# Patient Record
Sex: Female | Born: 1950
Health system: Southern US, Community
[De-identification: ages and names within clinical notes are randomized; demographics above are authoritative.]

## PROBLEM LIST (undated history)

## (undated) DIAGNOSIS — E079 Disorder of thyroid, unspecified: Secondary | ICD-10-CM

## (undated) DIAGNOSIS — T7840XA Allergy, unspecified, initial encounter: Secondary | ICD-10-CM

## (undated) HISTORY — DX: Allergy, unspecified, initial encounter: T78.40XA

## (undated) HISTORY — PX: TONSILLECTOMY: SUR1361

## (undated) HISTORY — PX: BREAST EXCISIONAL BIOPSY: SUR124

## (undated) HISTORY — DX: Disorder of thyroid, unspecified: E07.9

---

## 1998-12-15 ENCOUNTER — Encounter: Payer: Self-pay | Admitting: Family Medicine

## 1998-12-15 ENCOUNTER — Ambulatory Visit (HOSPITAL_COMMUNITY): Admission: RE | Admit: 1998-12-15 | Discharge: 1998-12-15 | Payer: Self-pay | Admitting: Obstetrics and Gynecology

## 2000-05-02 ENCOUNTER — Encounter: Payer: Self-pay | Admitting: Otolaryngology

## 2000-05-02 ENCOUNTER — Encounter: Admission: RE | Admit: 2000-05-02 | Discharge: 2000-05-02 | Payer: Self-pay | Admitting: Otolaryngology

## 2000-08-01 ENCOUNTER — Ambulatory Visit (HOSPITAL_COMMUNITY): Admission: RE | Admit: 2000-08-01 | Discharge: 2000-08-01 | Payer: Self-pay | Admitting: Family Medicine

## 2000-08-01 ENCOUNTER — Encounter: Payer: Self-pay | Admitting: Family Medicine

## 2001-08-31 ENCOUNTER — Ambulatory Visit (HOSPITAL_COMMUNITY): Admission: RE | Admit: 2001-08-31 | Discharge: 2001-08-31 | Payer: Self-pay | Admitting: Obstetrics and Gynecology

## 2001-08-31 ENCOUNTER — Encounter: Payer: Self-pay | Admitting: Obstetrics and Gynecology

## 2019-05-28 ENCOUNTER — Ambulatory Visit (INDEPENDENT_AMBULATORY_CARE_PROVIDER_SITE_OTHER): Payer: Medicare Other | Admitting: Allergy and Immunology

## 2019-05-28 ENCOUNTER — Other Ambulatory Visit: Payer: Self-pay

## 2019-05-28 VITALS — BP 130/80 | HR 62 | Resp 16

## 2019-05-28 DIAGNOSIS — J3089 Other allergic rhinitis: Secondary | ICD-10-CM | POA: Diagnosis not present

## 2019-05-28 MED ORDER — EPINEPHRINE 0.3 MG/0.3ML IJ SOAJ: 0.3000 mg | INTRAMUSCULAR | 2 refills | Status: DC | PRN

## 2019-05-28 NOTE — Patient Instructions (Addendum)
  1.  Continue immunotherapy from [place] [facility]  2.  Continue Flonase-1-2 sprays each nostril 1-7 times per week  3.  Continue OTC antihistamine if needed  4.  Continue EpiPen 0.3, Benadryl, MD/ER evaluation for allergic reaction  5.  Obtain a flu vaccine (and COVID vaccine) every year  6.  Return to clinic in 1 year or earlier if problem

## 2019-05-28 NOTE — Progress Notes (Signed)
Immunotherapy   Patient Details  Name: Valerie Crawford MRN: [id number] Date of Birth: 1951-05-06  05/28/2019  Valerie Crawford   MOLD-DM Following schedule: Other, Please see outside records for dosing  Frequency: .80mL every 7-31 days  Epi-Pen: Yes Consent signed and patient instructions given.   Ashleigh Fernandez-Vernon 05/28/2019, 10:06 AM

## 2019-05-28 NOTE — Progress Notes (Signed)
[place] - [place] - [place] - [place] - [place]   NEW PATIENT NOTE  Referring Provider: No ref. provider found Primary Provider: Patient, No Pcp Per Date of office visit: 05/28/2019    Subjective:   Chief Complaint:  Valerie Crawford (DOB: 02/12/1951) is a 68 y.o. female who presents to the clinic on 05/28/2019 with a chief complaint of Transfer allergy vials .  HPI: Valerie AngCathy presents to this clinic in evaluation of allergic disease.  Valerie MessierKathy has a long history of allergic rhinoconjunctivitis.  She started immunotherapy at [facility] in [place] about 2 years ago and has received dramatic improvement regarding both her eye and nose issues while using this form of therapy.  Currently she is using this form of therapy every 4 weeks without any adverse effect.  She continues to use Flonase and rarely an antihistamine.  She has a history of intermittent vertigo treated successfully with positional maneuvering and as needed meclizine.  History reviewed. No pertinent past medical history.  History reviewed. No pertinent surgical history.  Allergies as of 05/28/2019      Reactions   Other    Contrast dye - rash   Statins    Flu like syndrome      Medication List      fluticasone 50 MCG/ACT nasal spray Commonly known as: FLONASE Place into the nose.   levothyroxine 75 MCG tablet Commonly known as: SYNTHROID TK 1 T PO D   meclizine 12.5 MG tablet Commonly known as: ANTIVERT TK 1 T PO QHS PRF DIZZINESS       Review of systems negative except as noted in HPI / PMHx or noted below:  Review of Systems  Constitutional: Negative.   HENT: Negative.   Eyes: Negative.   Respiratory: Negative.   Cardiovascular: Negative.   Gastrointestinal: Negative.   Genitourinary: Negative.   Musculoskeletal: Negative.   Skin: Negative.   Neurological: Negative.   Endo/Heme/Allergies:       Hypothyroidism treated with replacement therapy   Psychiatric/Behavioral: Negative.     History reviewed. No pertinent family history.  Social History   Socioeconomic History  . Marital status: Divorced    Spouse name: Not on file  . Number of children: Not on file  . Years of education: Not on file  . Highest education level: Not on file  Occupational History  . Not on file  Social Needs  . Financial resource strain: Not on file  . Food insecurity    Worry: Not on file    Inability: Not on file  . Transportation needs    Medical: Not on file    Non-medical: Not on file  Tobacco Use  . Smoking status: Not on file  Substance and Sexual Activity  . Alcohol use: Not on file  . Drug use: Not on file  . Sexual activity: Not on file  Lifestyle  . Physical activity    Days per week: Not on file    Minutes per session: Not on file  . Stress: Not on file  Relationships  . Social Musicianconnections    Talks on phone: Not on file    Gets together: Not on file    Attends religious service: Not on file    Active member of club or organization: Not on file    Attends meetings of clubs or organizations: Not on file    Relationship status: Not on file  . Intimate partner violence    Fear of  current or ex partner: Not on file    Emotionally abused: Not on file    Physically abused: Not on file    Forced sexual activity: Not on file  Other Topics Concern  . Not on file  Social History Narrative  . Not on file    Environmental and Social history  Lives in a apartment with a dry environment, no animals located inside the household, carpet in the bedroom, plastic on the bed, plastic on the pillow, no smoking with inside the household.  She just moved from the [place] area this week.  Objective:   Vitals:   05/28/19 0956  BP: 130/80  Pulse: 62  Resp: 16  SpO2: 97%        Physical Exam Constitutional:      Appearance: She is not diaphoretic.  HENT:     Head: Normocephalic. No right periorbital erythema or left  periorbital erythema.     Right Ear: Tympanic membrane, ear canal and external ear normal.     Left Ear: Tympanic membrane, ear canal and external ear normal.     Nose: Nose normal. No mucosal edema or rhinorrhea.     Mouth/Throat:     Pharynx: No oropharyngeal exudate.  Eyes:     General: Lids are normal.     Conjunctiva/sclera: Conjunctivae normal.     Pupils: Pupils are equal, round, and reactive to light.  Neck:     Thyroid: No thyromegaly.     Trachea: Trachea normal. No tracheal deviation.  Cardiovascular:     Rate and Rhythm: Normal rate and regular rhythm.     Heart sounds: Normal heart sounds, S1 normal and S2 normal. No murmur.  Pulmonary:     Effort: Pulmonary effort is normal. No respiratory distress.     Breath sounds: No stridor. No wheezing or rales.  Chest:     Chest wall: No tenderness.  Abdominal:     General: There is no distension.     Palpations: Abdomen is soft. There is no mass.     Tenderness: There is no abdominal tenderness. There is no guarding or rebound.  Musculoskeletal:        General: No tenderness.  Lymphadenopathy:     Head:     Right side of head: No tonsillar adenopathy.     Left side of head: No tonsillar adenopathy.     Cervical: No cervical adenopathy.  Skin:    Coloration: Skin is not pale.     Findings: No erythema or rash.     Nails: There is no clubbing.   Neurological:     Mental Status: She is alert.     Diagnostics: Allergy skin tests were not performed.   Assessment and Plan:    1. Other allergic rhinitis     1.  Continue immunotherapy from [place] [facility]  2.  Continue Flonase-1-2 sprays each nostril 1-7 times per week  3.  Continue OTC antihistamine if needed  4.  Continue EpiPen 0.3, Benadryl, MD/ER evaluation for allergic reaction  5.  Obtain a flu vaccine (and COVID vaccine) every year  6.  Return to clinic in 1 year or earlier if problem  Valerie Crawford will continue to use immunotherapy from  [facility] in [place] and other anti-inflammatory therapy for her airway as noted above.  Assuming she does well I can see her back in this clinic every year while she continues to receive immunotherapy.  Valerie Katz, MD Allergy / Immunology  [facility]

## 2019-05-29 ENCOUNTER — Encounter: Payer: Self-pay | Admitting: Allergy and Immunology

## 2019-06-25 ENCOUNTER — Ambulatory Visit (INDEPENDENT_AMBULATORY_CARE_PROVIDER_SITE_OTHER): Payer: Medicare Other | Admitting: *Deleted

## 2019-06-25 DIAGNOSIS — J3089 Other allergic rhinitis: Secondary | ICD-10-CM

## 2019-07-23 ENCOUNTER — Ambulatory Visit (INDEPENDENT_AMBULATORY_CARE_PROVIDER_SITE_OTHER): Payer: Medicare Other

## 2019-07-23 ENCOUNTER — Telehealth: Payer: Self-pay

## 2019-07-23 DIAGNOSIS — J3089 Other allergic rhinitis: Secondary | ICD-10-CM | POA: Diagnosis not present

## 2019-07-23 NOTE — Telephone Encounter (Signed)
Patient's current vials are getting low. I have faxed the refill request to [place].

## 2019-07-30 NOTE — Telephone Encounter (Signed)
Patient's allergen vial has been mailed to the office and has been filed away accordingly.

## 2019-08-02 ENCOUNTER — Other Ambulatory Visit: Payer: Self-pay | Admitting: Family Medicine

## 2019-08-02 DIAGNOSIS — R928 Other abnormal and inconclusive findings on diagnostic imaging of breast: Secondary | ICD-10-CM

## 2019-08-13 ENCOUNTER — Ambulatory Visit
Admission: RE | Admit: 2019-08-13 | Discharge: 2019-08-13 | Disposition: A | Discharge status: Home / Self Care | Payer: Medicare Other | Source: Ambulatory Visit | Attending: Family Medicine | Admitting: Family Medicine

## 2019-08-13 ENCOUNTER — Other Ambulatory Visit: Payer: Self-pay

## 2019-08-13 DIAGNOSIS — R928 Other abnormal and inconclusive findings on diagnostic imaging of breast: Secondary | ICD-10-CM

## 2019-08-20 ENCOUNTER — Ambulatory Visit (INDEPENDENT_AMBULATORY_CARE_PROVIDER_SITE_OTHER): Payer: Medicare Other | Admitting: *Deleted

## 2019-08-20 DIAGNOSIS — J309 Allergic rhinitis, unspecified: Secondary | ICD-10-CM

## 2019-09-17 ENCOUNTER — Ambulatory Visit (INDEPENDENT_AMBULATORY_CARE_PROVIDER_SITE_OTHER): Payer: Medicare Other

## 2019-09-17 DIAGNOSIS — J309 Allergic rhinitis, unspecified: Secondary | ICD-10-CM | POA: Diagnosis not present

## 2019-09-27 ENCOUNTER — Ambulatory Visit: Payer: Self-pay | Admitting: *Deleted

## 2019-10-28 ENCOUNTER — Ambulatory Visit (INDEPENDENT_AMBULATORY_CARE_PROVIDER_SITE_OTHER): Payer: Medicare Other

## 2019-10-28 DIAGNOSIS — J309 Allergic rhinitis, unspecified: Secondary | ICD-10-CM

## 2019-11-04 ENCOUNTER — Ambulatory Visit (INDEPENDENT_AMBULATORY_CARE_PROVIDER_SITE_OTHER): Payer: Medicare Other

## 2019-11-04 DIAGNOSIS — J309 Allergic rhinitis, unspecified: Secondary | ICD-10-CM | POA: Diagnosis not present

## 2019-11-11 ENCOUNTER — Ambulatory Visit (INDEPENDENT_AMBULATORY_CARE_PROVIDER_SITE_OTHER): Payer: Medicare Other

## 2019-11-11 DIAGNOSIS — J309 Allergic rhinitis, unspecified: Secondary | ICD-10-CM | POA: Diagnosis not present

## 2019-12-10 ENCOUNTER — Ambulatory Visit (INDEPENDENT_AMBULATORY_CARE_PROVIDER_SITE_OTHER): Payer: Medicare Other

## 2019-12-10 DIAGNOSIS — J309 Allergic rhinitis, unspecified: Secondary | ICD-10-CM

## 2019-12-17 ENCOUNTER — Ambulatory Visit (INDEPENDENT_AMBULATORY_CARE_PROVIDER_SITE_OTHER): Payer: Medicare Other | Admitting: Allergy and Immunology

## 2019-12-17 ENCOUNTER — Encounter: Payer: Self-pay | Admitting: Allergy and Immunology

## 2019-12-17 ENCOUNTER — Other Ambulatory Visit: Payer: Self-pay

## 2019-12-17 VITALS — BP 126/70 | HR 67 | Temp 97.6°F | Resp 18 | Ht 64.0 in | Wt 163.2 lb

## 2019-12-17 DIAGNOSIS — J3089 Other allergic rhinitis: Secondary | ICD-10-CM

## 2019-12-17 DIAGNOSIS — Z20822 Contact with and (suspected) exposure to covid-19: Secondary | ICD-10-CM | POA: Diagnosis not present

## 2019-12-17 NOTE — Patient Instructions (Addendum)
  1.  Continue immunotherapy from [facility]  2.  Continue Flonase-1-2 sprays each nostril 1-7 times per week  3.  Continue OTC antihistamine if needed  4.  Continue EpiPen 0.3, Benadryl, MD/ER evaluation for allergic reaction  5.  Blood - Covid IgG... Covid vaccine???  6.  Return to clinic in 1 year or earlier if problem

## 2019-12-17 NOTE — Progress Notes (Signed)
[place] - [place] - [place] - [place] - [place]   Follow-up Note  Referring Provider: No ref. provider found Primary Provider: Patient, No Pcp Per Date of Office Visit: 12/17/2019  Subjective:   Valerie Crawford (DOB: February 07, 1951) is a 69 y.o. female who [facility] on 12[facility]/2019 in re-evaluation of the following:  HPI: Valerie Crawford returns to this clinic in evaluation of allergic rhinoconjunctivitis. I last saw her in this clinic on 28 May 2019.  She has been using a course of immunotherapy from [facility]. Her current administration is every month directed against dust mite and mold.  She has really done very well over the course of the past 6 months while continuing on immunotherapy and a nasal steroid.  She has not required a systemic steroid or an antibiotic for any type of airway issue.  She has not had any adverse effect from her immunotherapy.  She relates a history of having exposure to Covid on 2 different occasions.  She never developed any type of symptomatic infection.  She is very hesitant about receiving a Covid vaccine as she had a significant problem with flu vaccines in the past.  Basically her problem with flu vaccines in the is that she became "sick" with what sounds like constitutional symptoms and maybe low-grade fever.  Allergies as of 12/17/2019      Reactions   Other    Contrast dye - rash   Statins    Flu like syndrome      Medication List    EPINEPHrine 0.3 mg/0.3 mL Soaj injection Commonly known as: EPI-PEN Inject 0.3 mLs (0.3 mg total) into the muscle as needed for anaphylaxis.   fluticasone 50 MCG/ACT nasal spray Commonly known as: FLONASE Place into the nose.   levothyroxine 75 MCG tablet Commonly known as: SYNTHROID TK 1 T PO D       History reviewed. No pertinent past medical history.  Past Surgical History:  Procedure Laterality Date  . TONSILLECTOMY      Review of systems negative  except as noted in HPI / PMHx or noted below:  Review of Systems  Constitutional: Negative.   HENT: Negative.   Eyes: Negative.   Respiratory: Negative.   Cardiovascular: Negative.   Gastrointestinal: Negative.   Genitourinary: Negative.   Musculoskeletal: Negative.   Skin: Negative.   Neurological: Negative.   Endo/Heme/Allergies: Negative.   Psychiatric/Behavioral: Negative.      Objective:   Vitals:   12/17/19 1408  BP: 126/70  Crawford: 67  Resp: 18  Temp: 97.6 F (36.4 C)  SpO2: 96%   Height: 5\' 4"  (162.6 cm)  Weight: 163 lb 3.2 oz (74 kg)   Physical Exam Constitutional:      Appearance: She is not diaphoretic.  HENT:     Head: Normocephalic.     Right Ear: Tympanic membrane, ear canal and external ear normal.     Left Ear: Tympanic membrane, ear canal and external ear normal.     Nose: Nose normal. No mucosal edema or rhinorrhea.     Mouth/Throat:     Pharynx: Uvula midline. No oropharyngeal exudate.  Eyes:     Conjunctiva/sclera: Conjunctivae normal.  Neck:     Thyroid: No thyromegaly.     Trachea: Trachea normal. No tracheal tenderness or tracheal deviation.  Cardiovascular:     Rate and Rhythm: Normal rate and regular rhythm.     Heart sounds: Normal heart sounds, S1 normal and  S2 normal. No murmur.  Pulmonary:     Effort: No respiratory distress.     Breath sounds: Normal breath sounds. No stridor. No wheezing or rales.  Lymphadenopathy:     Head:     Right side of head: No tonsillar adenopathy.     Left side of head: No tonsillar adenopathy.     Cervical: No cervical adenopathy.  Skin:    Findings: No erythema or rash.     Nails: There is no clubbing.  Neurological:     Mental Status: She is alert.     Diagnostics: none  Assessment and Plan:   1. Perennial allergic rhinitis   2. Close exposure to COVID-19 virus     1.  Continue immunotherapy from [place] [facility]  2.  Continue Flonase-1-2 sprays each nostril 1-7 times per  week  3.  Continue OTC antihistamine if needed  4.  Continue EpiPen 0.3, Benadryl, MD/ER evaluation for allergic reaction  5.  Blood - Covid IgG... Covid vaccine???  6.  Return to clinic in 1 year or earlier if problem  Valerie Crawford will continue to use immunotherapy as this has resulted in very good improvement regarding her respiratory tract and conjunctival problems.  She will continue on the formulation from [facility] and also continue to use anti-inflammatory agents for her airway as noted above.  I will collect a Covid IgG titer and if it is positive then one could make the argument that the way she expresses a Covid infection is asymptomatically and there is no need for vaccine.  If it is negative then I would recommend that she undergo a Covid vaccination.  However, she is very hesitant about receiving a vaccine.  I also informed her that we have a protocol for testing for vaccine reactions and we could certainly have her undergo that evaluation which may give her some peace of mind to obtain the Covid vaccine.  [name], MD Allergy / Immunology [place]

## 2019-12-18 ENCOUNTER — Ambulatory Visit (INDEPENDENT_AMBULATORY_CARE_PROVIDER_SITE_OTHER): Payer: Medicare Other | Admitting: Internal Medicine

## 2019-12-18 ENCOUNTER — Encounter: Payer: Self-pay | Admitting: Internal Medicine

## 2019-12-18 ENCOUNTER — Encounter: Payer: Self-pay | Admitting: Allergy and Immunology

## 2019-12-18 VITALS — BP 119/70 | HR 76 | Temp 97.3°F | Ht 64.0 in | Wt 164.0 lb

## 2019-12-18 DIAGNOSIS — E7849 Other hyperlipidemia: Secondary | ICD-10-CM

## 2019-12-18 NOTE — Patient Instructions (Addendum)
Medication Instructions:  Your physician recommends that you continue on your current medications as directed. Please refer to the Current Medication list given to you today.  *If you need a refill on your cardiac medications before your next appointment, please call your pharmacy*  Lab Work: NONE If you have labs (blood work) drawn today and your tests are completely normal, you will receive your results only by: Marland Kitchen MyChart Message (if you have MyChart) OR . A paper copy in the mail If you have any lab test that is abnormal or we need to change your treatment, we will call you to review the results.  Testing/Procedures: Dr. Debara Pickett has ordered a CT coronary calcium score. This is $150 out of pocket  Coronary CalciumScan A coronary calcium scan is an imaging test used to look for deposits of calcium and other fatty materials (plaques) in the inner lining of the blood vessels of the heart (coronary arteries). These deposits of calcium and plaques can partly clog and narrow the coronary arteries without producing any symptoms or warning signs. This puts a person at risk for a heart attack. This test can detect these deposits before symptoms develop. Tell a health care provider about:  Any allergies you have.  All medicines you are taking, including vitamins, herbs, eye drops, creams, and over-the-counter medicines.  Any problems you or family members have had with anesthetic medicines.  Any blood disorders you have.  Any surgeries you have had.  Any medical conditions you have.  Whether you are pregnant or may be pregnant. What are the risks? Generally, this is a safe procedure. However, problems may occur, including:  Harm to a pregnant woman and her unborn baby. This test involves the use of radiation. Radiation exposure can be dangerous to a pregnant woman and her unborn baby. If you are pregnant, you generally should not have this procedure done.  Slight increase in the risk of  cancer. This is because of the radiation involved in the test. What happens before the procedure? No preparation is needed for this procedure. What happens during the procedure?  You will undress and remove any jewelry around your neck or chest.  You will put on a hospital gown.  Sticky electrodes will be placed on your chest. The electrodes will be connected to an electrocardiogram (ECG) machine to record a tracing of the electrical activity of your heart.  A CT scanner will take pictures of your heart. During this time, you will be asked to lie still and hold your breath for 2-3 seconds while a picture of your heart is being taken. The procedure may vary among health care providers and hospitals. What happens after the procedure?  You can get dressed.  You can return to your normal activities.  It is up to you to get the results of your test. Ask your health care provider, or the department that is doing the test, when your results will be ready. Summary  A coronary calcium scan is an imaging test used to look for deposits of calcium and other fatty materials (plaques) in the inner lining of the blood vessels of the heart (coronary arteries).  Generally, this is a safe procedure. Tell your health care provider if you are pregnant or may be pregnant.  No preparation is needed for this procedure.  A CT scanner will take pictures of your heart.  You can return to your normal activities after the scan is done. This information is not intended to replace advice  given to you by your health care provider. Make sure you discuss any questions you have with your health care provider. Document Released: 04/28/2008 Document Revised: 09/19/2016 Document Reviewed: 09/19/2016 Elsevier Interactive Patient Education  2017 [employer].    Follow-Up: At [facility], you and your health needs are our priority.  As part of our continuing mission to provide you with exceptional heart care, we  have created designated Provider Care Teams.  These Care Teams include your primary Cardiologist (physician) and Advanced Practice Providers (APPs -  Physician Assistants and Nurse Practitioners) who all work together to provide you with the care you need, when you need it.  Your next appointment:   After coronary calcium test is completed   The format for your next appointment:   Virtual Visit - lipid clinic  Provider:   K. Italy Hilty, MD  Other Instructions

## 2019-12-18 NOTE — Progress Notes (Signed)
LIPID CLINIC CONSULT NOTE  Chief Complaint:  Manage dyslipidemia  Primary Care Physician: Clayborn Heron, MD  Primary Cardiologist:  No primary care provider on file.  HPI:  Valerie Crawford is a 69 y.o. female who is being seen today for the evaluation of dyslipidemia at the request of Rankins, Fanny Dance, MD.  This is a pleasant 69 year old female kindly referred for evaluation and management of dyslipidemia.  She reports this is been a longstanding problem and she is seen multiple doctors, prior cardiologist and other providers and management of this both out in [place] and more recently when she was living in [place].  She is recently retired and was referred for further evaluation and management.  She brought records with her from the past 5 or 6 years which showed significant dyslipidemia.  Only at 1 point where she able to reduce her LDL cholesterol well below 200 with significant dietary changes.  She was on the "O blood type" diet.  Unfortunately she has not been able to tolerate statins, all caused flulike symptoms, at least 3 statins and to high potency statins in the past.  She also recently tried ezetimibe and said overall she had flulike symptoms, myalgias and just felt "bad".  Her most recent lipid profile showed total cholesterol 329, triglycerides 342, HDL 46 and LDL of 212.  She reports her mother had significant dyslipidemia however had no early onset heart disease. PMHx:  Past Medical History:  Diagnosis Date  . Allergies   . Thyroid disease     Past Surgical History:  Procedure Laterality Date  . TONSILLECTOMY      FAMHx:  Family History  Problem Relation Age of Onset  . Hyperlipidemia Mother   . Arthritis Father     SOCHx:   reports that she quit smoking about 51 years ago. She has quit using smokeless tobacco. She reports previous alcohol use. She reports that she does not use drugs.  ALLERGIES:  Allergies  Allergen Reactions  . Other    Contrast dye - rash  . Statins     Flu like syndrome    ROS: Pertinent items noted in HPI and remainder of comprehensive ROS otherwise negative.  HOME MEDS: Current Outpatient Medications on File Prior to Visit  Medication Sig Dispense Refill  . EPINEPHrine 0.3 mg/0.3 mL IJ SOAJ injection Inject 0.3 mLs (0.3 mg total) into the muscle as needed for anaphylaxis. 2 each 2  . fluticasone (FLONASE) 50 MCG/ACT nasal spray Place into the nose.    . levothyroxine (SYNTHROID) 75 MCG tablet TK 1 T PO D     No current facility-administered medications on file prior to visit.    LABS/IMAGING: No results found for this or any previous visit (from the past 48 hour(s)). No results found.  LIPID PANEL: No results found for: CHOL, TRIG, HDL, CHOLHDL, VLDL, LDLCALC, LDLDIRECT  WEIGHTS: Wt Readings from Last 3 Encounters:  12/18/19 164 lb (74.4 kg)  12/17/19 163 lb 3.2 oz (74 kg)    VITALS: BP 119/70   Pulse 76   Temp (!) 97.3 F (36.3 C)   Ht 5\' 4"  (1.626 m)   Wt 164 lb (74.4 kg)   SpO2 98%   BMI 28.15 kg/m   EXAM: General appearance: alert and no distress Lungs: clear to auscultation bilaterally Heart: regular rate and rhythm, S1, S2 normal, no murmur, click, rub or gallop Extremities: extremities normal, atraumatic, no cyanosis or edema Neurologic: Grossly normal  EKG: Deferred  ASSESSMENT: 1. Possible familial combined hyperlipidemia 2. No significant early onset heart disease in the family  PLAN: 1.   Valerie Crawford has a significant elevation in triglycerides, LDL and low HDL suggestive of familial combined hyperlipidemia.  Interestingly though she has no evidence for early onset coronary disease.  This was not also an issue in her family.  I am interested as to whether or not there is any evidence for any atherosclerosis and will order coronary calcium score.  If this is significantly elevated then I would likely recommend aggressive treatment to lower LDL greater than 50%,  likely with a PCSK9 inhibitor due to her intolerance to statins.  Alternatively, if there is no or little coronary calcification, would argue that phenotypically her risk is not as high and therefore we may choose an alternative such as Nexletol.  Thanks again for this interesting referral.  Pixie Casino, MD, FACC, [place] Director of the Advanced Lipid Disorders &  Cardiovascular Risk Reduction Clinic Diplomate of the American Board of Clinical Lipidology Attending Cardiologist  Direct Dial: [phone]  Fax: [phone]  Website:  [email or web address]  Valerie Crawford Valerie Crawford 12/18/2019, 3:12 PM

## 2019-12-19 LAB — SAR COV2 SEROLOGY (COVID19)AB(IGG),IA: DiaSorin SARS-CoV-2 Ab, IgG: NEGATIVE

## 2020-01-07 ENCOUNTER — Other Ambulatory Visit: Payer: Self-pay

## 2020-01-07 ENCOUNTER — Ambulatory Visit (INDEPENDENT_AMBULATORY_CARE_PROVIDER_SITE_OTHER)
Admission: RE | Admit: 2020-01-07 | Discharge: 2020-01-07 | Disposition: A | Discharge status: Home / Self Care | Payer: Medicare Other | Source: Ambulatory Visit | Attending: Internal Medicine | Admitting: Internal Medicine

## 2020-01-07 ENCOUNTER — Ambulatory Visit (INDEPENDENT_AMBULATORY_CARE_PROVIDER_SITE_OTHER): Payer: Medicare Other

## 2020-01-07 DIAGNOSIS — E7849 Other hyperlipidemia: Secondary | ICD-10-CM

## 2020-01-07 DIAGNOSIS — J3089 Other allergic rhinitis: Secondary | ICD-10-CM | POA: Diagnosis not present

## 2020-01-09 ENCOUNTER — Other Ambulatory Visit: Payer: Self-pay | Admitting: *Deleted

## 2020-01-09 DIAGNOSIS — E7849 Other hyperlipidemia: Secondary | ICD-10-CM

## 2020-01-09 MED ORDER — NEXLETOL 180 MG PO TABS: 1.0000 | ORAL_TABLET | Freq: Every day | ORAL | 11 refills | Status: DC

## 2020-01-15 ENCOUNTER — Telehealth: Payer: Self-pay | Admitting: Internal Medicine

## 2020-01-15 NOTE — Telephone Encounter (Signed)
PA for Nexletol 180mg  daily submitted via [facility] Key: [facility] - PA Case ID: [facility]

## 2020-01-15 NOTE — Telephone Encounter (Signed)
-----   Message from Lindell Spar, RN sent at 01/15/2020 12:56 PM EST ----- Regarding: nexletol PA ID: [id number] BIN: [id number] PCN: [id number] RxGrp: [id number]

## 2020-01-17 NOTE — Telephone Encounter (Signed)
MyChart message sent to patient about med approval.

## 2020-01-17 NOTE — Telephone Encounter (Signed)
Approved. This drug has been approved under the Member's Medicare Part D benefit. Approved quantity: 30 tablets per 30 day(s). You may fill up to a 90 day supply except for those on Specialty Tier 5, which can be filled up to a 30 day supply.

## 2020-01-21 ENCOUNTER — Ambulatory Visit: Payer: Medicare Other | Admitting: Internal Medicine

## 2020-02-04 ENCOUNTER — Ambulatory Visit (INDEPENDENT_AMBULATORY_CARE_PROVIDER_SITE_OTHER): Payer: Medicare Other

## 2020-02-04 DIAGNOSIS — J3089 Other allergic rhinitis: Secondary | ICD-10-CM

## 2020-02-12 ENCOUNTER — Ambulatory Visit: Payer: Medicare Other | Admitting: Internal Medicine

## 2020-03-02 ENCOUNTER — Encounter: Payer: Self-pay | Admitting: Internal Medicine

## 2020-03-02 ENCOUNTER — Ambulatory Visit (INDEPENDENT_AMBULATORY_CARE_PROVIDER_SITE_OTHER): Payer: Medicare Other | Admitting: Internal Medicine

## 2020-03-02 ENCOUNTER — Other Ambulatory Visit: Payer: Self-pay

## 2020-03-02 VITALS — BP 128/76 | HR 75 | Temp 96.6°F | Ht 64.0 in | Wt 166.6 lb

## 2020-03-02 DIAGNOSIS — E7849 Other hyperlipidemia: Secondary | ICD-10-CM

## 2020-03-02 NOTE — Patient Instructions (Signed)
Medication Instructions:  Your physician recommends that you continue on your current medications as directed. Please refer to the Current Medication list given to you today.  Please contact our office if you decide to start Nexletol  *If you need a refill on your cardiac medications before your next appointment, please call your pharmacy*  Follow-Up: At [facility], you and your health needs are our priority.  As part of our continuing mission to provide you with exceptional heart care, we have created designated Provider Care Teams.  These Care Teams include your primary Cardiologist (physician) and Advanced Practice Providers (APPs -  Physician Assistants and Nurse Practitioners) who all work together to provide you with the care you need, when you need it.  We recommend signing up for the patient portal called "MyChart".  Sign up information is provided on this After Visit Summary.  MyChart is used to connect with patients for Virtual Visits (Telemedicine).  Patients are able to view lab/test results, encounter notes, upcoming appointments, etc.  Non-urgent messages can be sent to your provider as well.   To learn more about what you can do with MyChart, go to [email or web address].    Your next appointment:  As needed with Dr. Rennis Golden

## 2020-03-03 ENCOUNTER — Ambulatory Visit (INDEPENDENT_AMBULATORY_CARE_PROVIDER_SITE_OTHER): Payer: Medicare Other

## 2020-03-03 ENCOUNTER — Encounter: Payer: Self-pay | Admitting: Internal Medicine

## 2020-03-03 DIAGNOSIS — J309 Allergic rhinitis, unspecified: Secondary | ICD-10-CM

## 2020-03-03 NOTE — Progress Notes (Signed)
LIPID CLINIC CONSULT NOTE  Chief Complaint:  Follow-up dyslipidemia  Primary Care Physician: Aretta Nip, MD  Primary Cardiologist:  No primary care provider on file.  HPI:  Valerie Crawford is a 69 y.o. female who is being seen today for the evaluation of dyslipidemia at the request of Rankins, Bill Salinas, MD.  This is a pleasant 69 year old female kindly referred for evaluation and management of dyslipidemia.  She reports this is been a longstanding problem and she is seen multiple doctors, prior cardiologist and other providers and management of this both out in [place] and more recently when she was living in [place].  She is recently retired and was referred for further evaluation and management.  She brought records with her from the past 5 or 6 years which showed significant dyslipidemia.  Only at 1 point where she able to reduce her LDL cholesterol well below 200 with significant dietary changes.  She was on the "O blood type" diet.  Unfortunately she has not been able to tolerate statins, all caused flulike symptoms, at least 3 statins and to high potency statins in the past.  She also recently tried ezetimibe and said overall she had flulike symptoms, myalgias and just felt "bad".  Her most recent lipid profile showed total cholesterol 329, triglycerides 342, HDL 46 and LDL of 212.  She reports her mother had significant dyslipidemia however had no early onset heart disease.  02/22/2020  Valerie Crawford returns today for follow-up.  Only approved for Nexletol as an alternative for management of dyslipidemia.  After much discussion with her, she has not started the medication.  She says she has a lot of hesitation about the possible side effects of medication including risk for upper respiratory infection.  I told her that in my experience so far over the past year as well as preclinical trial experience, this was extremely unlikely and perhaps only happened to 1 patient.   Nonetheless, she is still very hesitant to use the medication.  PMHx:  Past Medical History:  Diagnosis Date  . Allergies   . Thyroid disease     Past Surgical History:  Procedure Laterality Date  . TONSILLECTOMY      FAMHx:  Family History  Problem Relation Age of Onset  . Hyperlipidemia Mother   . Arthritis Father     SOCHx:   reports that she quit smoking about 51 years ago. She has quit using smokeless tobacco. She reports previous alcohol use. She reports that she does not use drugs.  ALLERGIES:  Allergies  Allergen Reactions  . Other     Contrast dye - rash  . Statins     Flu like syndrome    ROS: Pertinent items noted in HPI and remainder of comprehensive ROS otherwise negative.  HOME MEDS: Current Outpatient Medications on File Prior to Visit  Medication Sig Dispense Refill  . Bempedoic Acid (NEXLETOL) 180 MG TABS Take 1 tablet by mouth daily. 30 tablet 11  . EPINEPHrine 0.3 mg/0.3 mL IJ SOAJ injection Inject 0.3 mLs (0.3 mg total) into the muscle as needed for anaphylaxis. 2 each 2  . fluticasone (FLONASE) 50 MCG/ACT nasal spray Place into the nose.    . levothyroxine (SYNTHROID) 75 MCG tablet TK 1 T PO D     No current facility-administered medications on file prior to visit.    LABS/IMAGING: No results found for this or any previous visit (from the past 48 hour(s)). No results found.  LIPID PANEL:  No results found for: CHOL, TRIG, HDL, CHOLHDL, VLDL, LDLCALC, LDLDIRECT  WEIGHTS: Wt Readings from Last 3 Encounters:  03/02/20 166 lb 9.6 oz (75.6 kg)  12/18/19 164 lb (74.4 kg)  12/17/19 163 lb 3.2 oz (74 kg)    VITALS: BP 128/76   Pulse 75   Temp (!) 96.6 F (35.9 C)   Ht 5\' 4"  (1.626 m)   Wt 166 lb 9.6 oz (75.6 kg)   SpO2 96%   BMI 28.60 kg/m   EXAM: General appearance: alert and no distress Lungs: clear to auscultation bilaterally Heart: regular rate and rhythm, S1, S2 normal, no murmur, click, rub or gallop Extremities:  extremities normal, atraumatic, no cyanosis or edema Neurologic: Grossly normal  EKG: Deferred  ASSESSMENT: 1. Possible familial combined hyperlipidemia 2. No significant early onset heart disease in the family  PLAN: 1.   Valerie Crawford as previously mentioned had numerous sensitivities to medications and has reluctance to use Nexletol.  There are few other options for her.  We discussed PCSK9 inhibitors however I do not think she will qualify for coronary artery calcium.  In addition these can be associated with flulike symptoms which she had previously had with statins and other medications.  She will reconsider whether she wants to try Nexletol, otherwise can follow-up with me as needed.  Mel Almond, MD, [facility], FACP  [place]  [facility]  Medical Director of the [facility]id [facility] &  Cardiovascular Risk Reduction Clinic Diplomate of the American Board of Clinical Lipidology Attending Cardiologist  Direct Dial: [phone]  Fax: [phone]  Website:  [phone] 03/03/2020, 9:50 PM

## 2020-03-24 ENCOUNTER — Telehealth: Payer: Self-pay | Admitting: *Deleted

## 2020-03-24 NOTE — Telephone Encounter (Signed)
Called and advised to patient that her allergy vial from [facility] was getting low and that when she comes in for next injection she will need to sign her shot record so that we can fax a request to re-order her next vial. Patient verbalized understanding and will come in next week to get her injection and sign the sheet.

## 2020-03-25 NOTE — Telephone Encounter (Signed)
I have placed a note in patient's flowsheet to be sure she signs reorder forms at her next injection.

## 2020-03-31 ENCOUNTER — Ambulatory Visit (INDEPENDENT_AMBULATORY_CARE_PROVIDER_SITE_OTHER): Payer: Medicare Other

## 2020-03-31 DIAGNOSIS — J309 Allergic rhinitis, unspecified: Secondary | ICD-10-CM

## 2020-04-28 ENCOUNTER — Ambulatory Visit (INDEPENDENT_AMBULATORY_CARE_PROVIDER_SITE_OTHER): Payer: Medicare Other

## 2020-04-28 DIAGNOSIS — J309 Allergic rhinitis, unspecified: Secondary | ICD-10-CM | POA: Diagnosis not present

## 2020-05-26 ENCOUNTER — Ambulatory Visit (INDEPENDENT_AMBULATORY_CARE_PROVIDER_SITE_OTHER): Payer: Medicare Other

## 2020-05-26 DIAGNOSIS — J309 Allergic rhinitis, unspecified: Secondary | ICD-10-CM | POA: Diagnosis not present

## 2020-06-02 ENCOUNTER — Ambulatory Visit (INDEPENDENT_AMBULATORY_CARE_PROVIDER_SITE_OTHER): Payer: Medicare Other

## 2020-06-02 DIAGNOSIS — J309 Allergic rhinitis, unspecified: Secondary | ICD-10-CM

## 2020-06-09 ENCOUNTER — Ambulatory Visit (INDEPENDENT_AMBULATORY_CARE_PROVIDER_SITE_OTHER): Payer: Medicare Other

## 2020-06-09 DIAGNOSIS — J309 Allergic rhinitis, unspecified: Secondary | ICD-10-CM

## 2020-07-07 ENCOUNTER — Ambulatory Visit (INDEPENDENT_AMBULATORY_CARE_PROVIDER_SITE_OTHER): Payer: Medicare Other

## 2020-07-07 DIAGNOSIS — J309 Allergic rhinitis, unspecified: Secondary | ICD-10-CM | POA: Diagnosis not present

## 2020-08-04 ENCOUNTER — Ambulatory Visit (INDEPENDENT_AMBULATORY_CARE_PROVIDER_SITE_OTHER): Payer: Medicare Other | Admitting: *Deleted

## 2020-08-04 DIAGNOSIS — J309 Allergic rhinitis, unspecified: Secondary | ICD-10-CM | POA: Diagnosis not present

## 2020-09-01 ENCOUNTER — Ambulatory Visit (INDEPENDENT_AMBULATORY_CARE_PROVIDER_SITE_OTHER): Payer: Medicare Other

## 2020-09-01 DIAGNOSIS — J309 Allergic rhinitis, unspecified: Secondary | ICD-10-CM | POA: Diagnosis not present

## 2020-09-29 ENCOUNTER — Ambulatory Visit (INDEPENDENT_AMBULATORY_CARE_PROVIDER_SITE_OTHER): Payer: Medicare Other

## 2020-09-29 DIAGNOSIS — J309 Allergic rhinitis, unspecified: Secondary | ICD-10-CM | POA: Diagnosis not present

## 2020-10-19 IMAGING — CT CT CARDIAC CORONARY ARTERY CALCIUM SCORE
3 series · 14 of 20 positions shown, 15 images · non-contrast
Comparison: None.
COMPARISON: None.

Addendum:
EXAM:
OVER-READ INTERPRETATION  CT CHEST

The following report is an over-read performed by radiologist Dr.
Renato David Virgil [REDACTED] on 01/07/2020. This over-read
does not include interpretation of cardiac or coronary anatomy or
pathology. The calcium score interpretation by the cardiologist is
attached.
CLINICAL DATA: Risk stratification
Coronary Calcium Score
TECHNIQUE: The patient was scanned on a Siemens Force scanner. Axial
non-contrast 3 mm slices were carried out through the heart. The
data set was analyzed on a dedicated work station and scored using
the Agatson method.

[Series 2: casc 3.0 bv41 2 bestdiast 68 % · axial · 0.29mm/px · z∈[-247,-169]mm · 4 of 44 slices shown, 5 images]
[im 9/44  vessel]
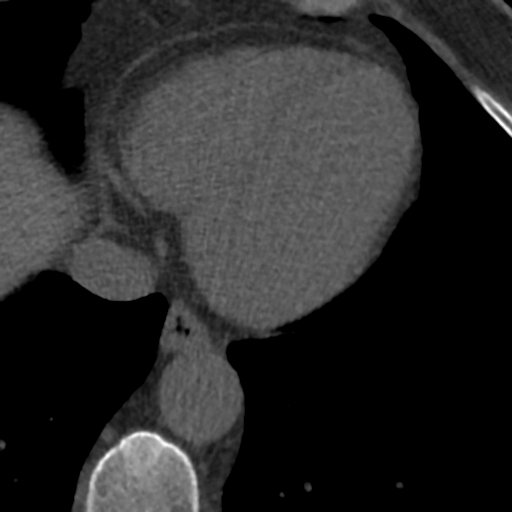
[im 9/44  lung]
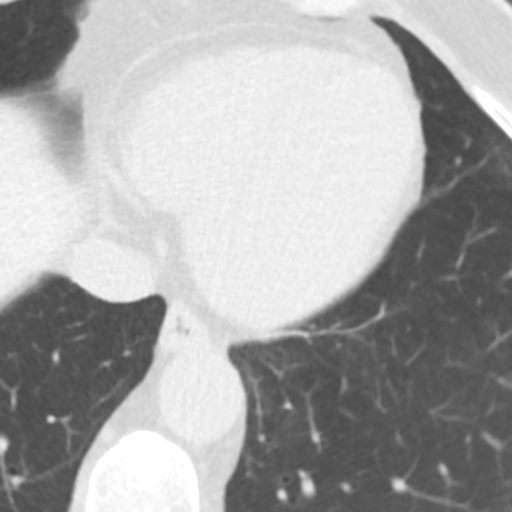
[im 18/44  vessel]
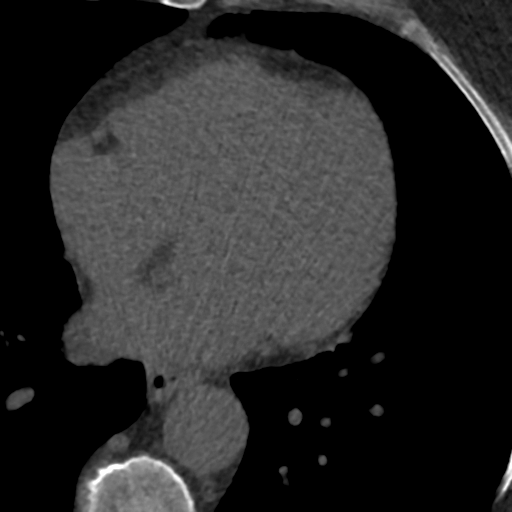
[im 26/44  vessel]
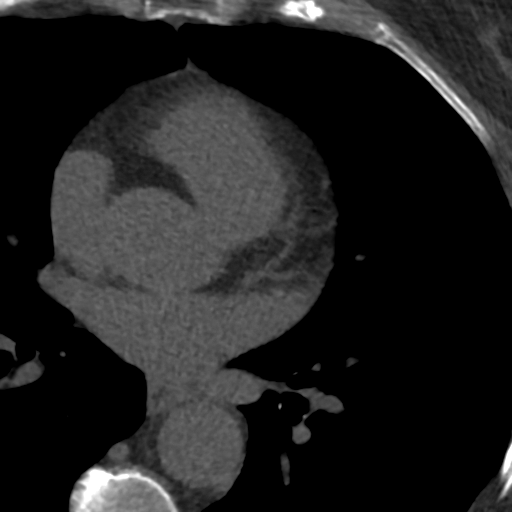
[im 35/44  vessel]
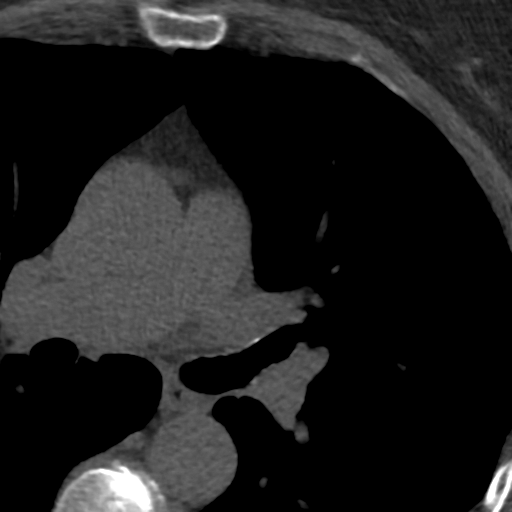

[Series 3: lung 69 % · axial · 0.62mm/px · z∈[-250,-166]mm · 5 of 44 slices shown]
[im 8/44  lung]
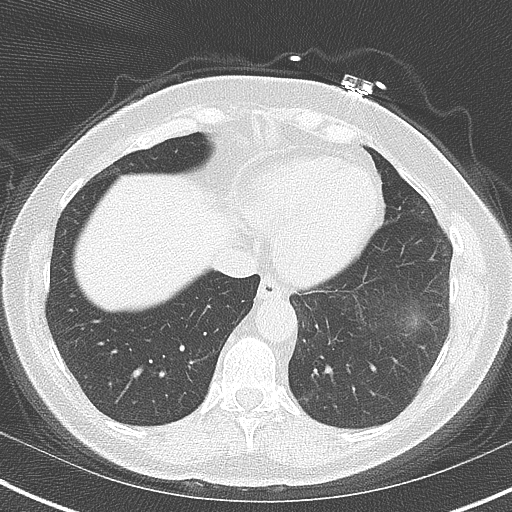
[im 15/44  lung]
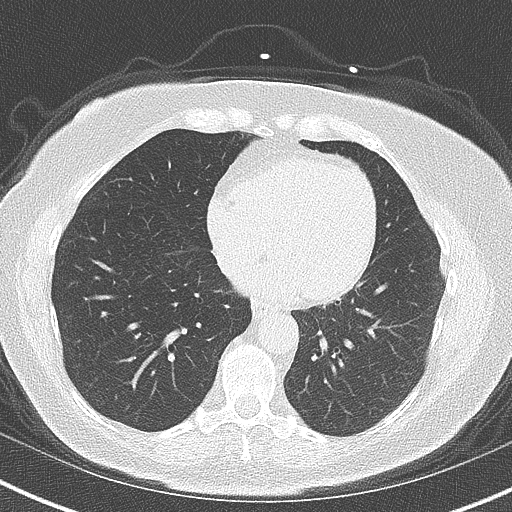
[im 22/44  lung]
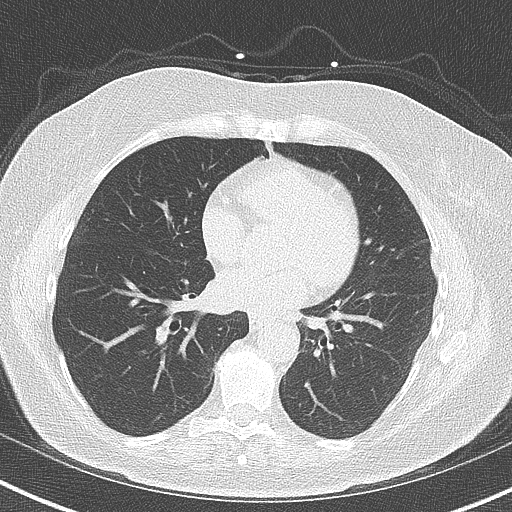
[im 29/44  lung]
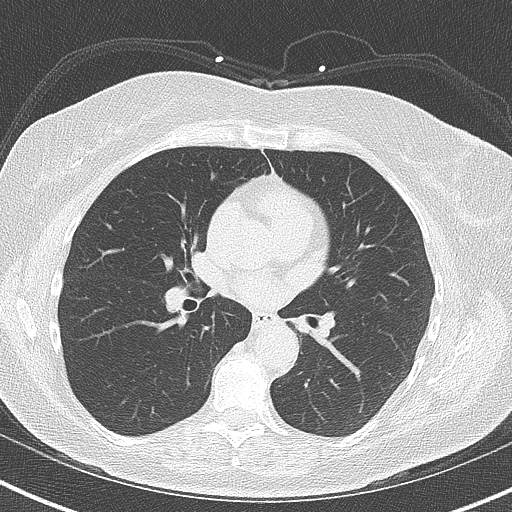
[im 36/44  lung]
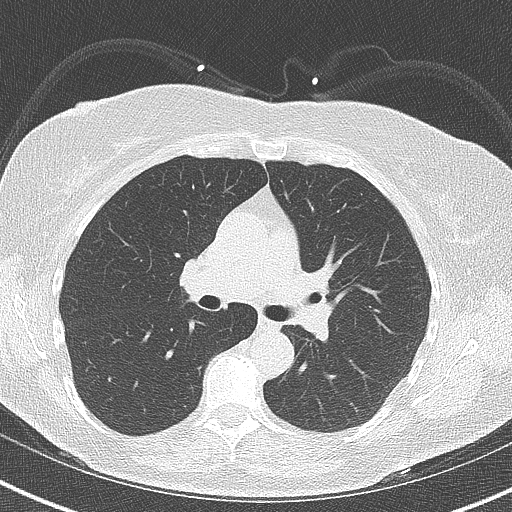

[Series 4: lung st 69 % · axial · 0.62mm/px · z∈[-250,-166]mm · 5 of 44 slices shown]
[im 8/44  lung]
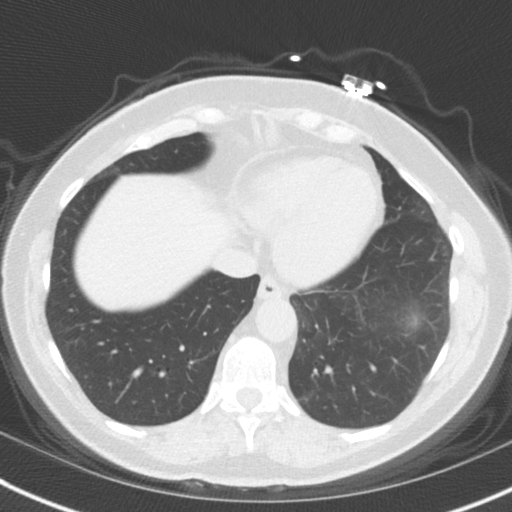
[im 15/44  lung]
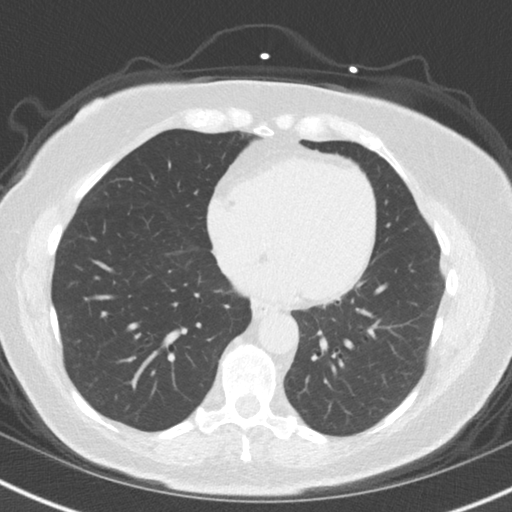
[im 22/44  lung]
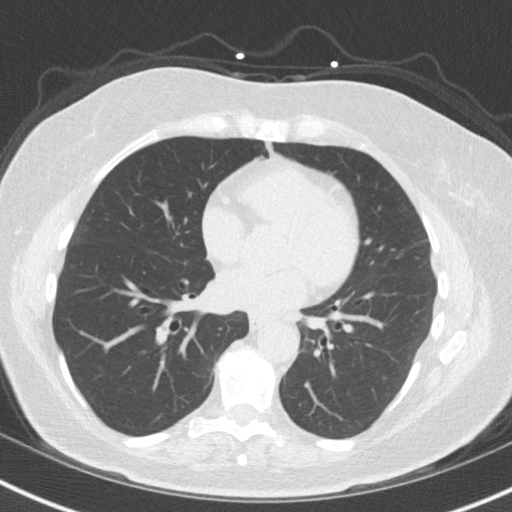
[im 29/44  lung]
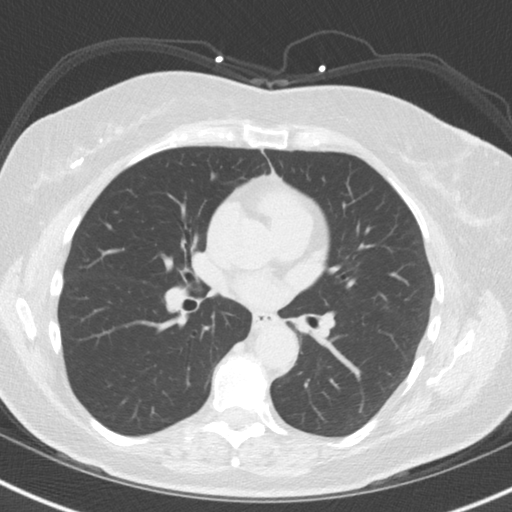
[im 36/44  lung]
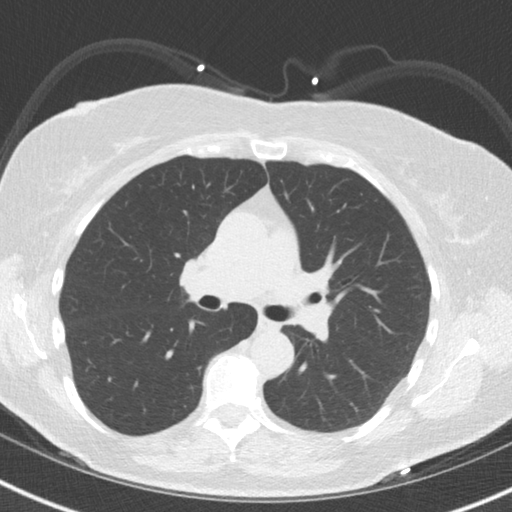

[14 of 20 positions shown; findings below may reference images not displayed]

FINDINGS: Vascular: Normal aortic caliber.

Mediastinum/Nodes: No imaged thoracic adenopathy.

Lungs/Pleura: No pleural fluid.  Clear imaged lungs.

Upper Abdomen: Left hepatic lobe 1.3 cm cyst. Normal imaged portions
of the spleen, stomach.

Musculoskeletal: No acute osseous abnormality. Moderate midthoracic
spondylosis.
IMPRESSION: No acute findings in the imaged extracardiac chest.
FINDINGS: Non-cardiac: See separate report from [REDACTED].

Ascending Aorta: Normal caliber

Pericardium: Normal

Coronary arteries: Normal origin.  No calcium.
IMPRESSION: Coronary calcium score of 0 . This was 0 percentile for age and sex
matched control.

*** End of Addendum ***
EXAM:
OVER-READ INTERPRETATION  CT CHEST

The following report is an over-read performed by radiologist Dr.
Renato David Virgil [REDACTED] on 01/07/2020. This over-read
does not include interpretation of cardiac or coronary anatomy or
pathology. The calcium score interpretation by the cardiologist is
attached.
FINDINGS: Vascular: Normal aortic caliber.

Mediastinum/Nodes: No imaged thoracic adenopathy.

Lungs/Pleura: No pleural fluid.  Clear imaged lungs.

Upper Abdomen: Left hepatic lobe 1.3 cm cyst. Normal imaged portions
of the spleen, stomach.

Musculoskeletal: No acute osseous abnormality. Moderate midthoracic
spondylosis.
IMPRESSION: No acute findings in the imaged extracardiac chest.

## 2020-10-27 ENCOUNTER — Ambulatory Visit (INDEPENDENT_AMBULATORY_CARE_PROVIDER_SITE_OTHER): Payer: Medicare Other

## 2020-10-27 DIAGNOSIS — J309 Allergic rhinitis, unspecified: Secondary | ICD-10-CM | POA: Diagnosis not present

## 2020-11-24 ENCOUNTER — Ambulatory Visit (INDEPENDENT_AMBULATORY_CARE_PROVIDER_SITE_OTHER): Payer: Medicare Other

## 2020-11-24 DIAGNOSIS — J309 Allergic rhinitis, unspecified: Secondary | ICD-10-CM | POA: Diagnosis not present

## 2020-11-28 ENCOUNTER — Other Ambulatory Visit: Payer: Self-pay | Admitting: Family Medicine

## 2020-11-28 DIAGNOSIS — E2839 Other primary ovarian failure: Secondary | ICD-10-CM

## 2020-12-22 ENCOUNTER — Ambulatory Visit (INDEPENDENT_AMBULATORY_CARE_PROVIDER_SITE_OTHER): Payer: Medicare Other | Admitting: *Deleted

## 2020-12-22 DIAGNOSIS — J309 Allergic rhinitis, unspecified: Secondary | ICD-10-CM

## 2021-01-19 ENCOUNTER — Ambulatory Visit (INDEPENDENT_AMBULATORY_CARE_PROVIDER_SITE_OTHER): Payer: Medicare Other | Admitting: *Deleted

## 2021-01-19 DIAGNOSIS — J309 Allergic rhinitis, unspecified: Secondary | ICD-10-CM | POA: Diagnosis not present

## 2021-01-26 ENCOUNTER — Ambulatory Visit (INDEPENDENT_AMBULATORY_CARE_PROVIDER_SITE_OTHER): Payer: Medicare Other | Admitting: *Deleted

## 2021-01-26 DIAGNOSIS — J309 Allergic rhinitis, unspecified: Secondary | ICD-10-CM | POA: Diagnosis not present

## 2021-02-02 ENCOUNTER — Ambulatory Visit (INDEPENDENT_AMBULATORY_CARE_PROVIDER_SITE_OTHER): Payer: Medicare Other | Admitting: *Deleted

## 2021-02-02 DIAGNOSIS — J309 Allergic rhinitis, unspecified: Secondary | ICD-10-CM | POA: Diagnosis not present

## 2021-03-02 ENCOUNTER — Ambulatory Visit (INDEPENDENT_AMBULATORY_CARE_PROVIDER_SITE_OTHER): Payer: Medicare Other | Admitting: *Deleted

## 2021-03-02 DIAGNOSIS — J309 Allergic rhinitis, unspecified: Secondary | ICD-10-CM | POA: Diagnosis not present

## 2021-03-25 ENCOUNTER — Other Ambulatory Visit: Payer: Self-pay

## 2021-03-25 ENCOUNTER — Ambulatory Visit
Admission: RE | Admit: 2021-03-25 | Discharge: 2021-03-25 | Disposition: A | Discharge status: Home / Self Care | Payer: Medicare Other | Source: Ambulatory Visit | Attending: Family Medicine | Admitting: Family Medicine

## 2021-03-25 DIAGNOSIS — E2839 Other primary ovarian failure: Secondary | ICD-10-CM

## 2021-03-30 ENCOUNTER — Telehealth: Payer: Self-pay | Admitting: *Deleted

## 2021-03-30 ENCOUNTER — Ambulatory Visit (INDEPENDENT_AMBULATORY_CARE_PROVIDER_SITE_OTHER): Payer: Medicare Other | Admitting: *Deleted

## 2021-03-30 DIAGNOSIS — J309 Allergic rhinitis, unspecified: Secondary | ICD-10-CM | POA: Diagnosis not present

## 2021-03-30 NOTE — Telephone Encounter (Signed)
Patient came in for her injection and I advised patient that the next time she comes in for her injection it will be time to re-order her allergy vial from [facility] in [place]. Patient stated that she received a letter from their office stating that she is due for an office visit with their office. I advised that they do likely need her to come in and be seen in order to keep writing her prescription for her allergy injection. Patient stated that she is not driving back to [facility] for an office visit. I advised to call their office and see what they say about her needing to be seen with them and if this will interfere with them making her next allergy vial to call us back and let us know. She asked if Dr. Lucie Leather will want to do skin testing on, I advised possibly but it depended on what Dr. Lucie Leather preferred. Patient verbalized understanding and is going to call back with an update.

## 2021-04-27 ENCOUNTER — Ambulatory Visit (INDEPENDENT_AMBULATORY_CARE_PROVIDER_SITE_OTHER): Payer: Medicare Other | Admitting: *Deleted

## 2021-04-27 DIAGNOSIS — J309 Allergic rhinitis, unspecified: Secondary | ICD-10-CM | POA: Diagnosis not present

## 2021-05-25 ENCOUNTER — Ambulatory Visit (INDEPENDENT_AMBULATORY_CARE_PROVIDER_SITE_OTHER): Payer: Medicare Other | Admitting: *Deleted

## 2021-05-25 DIAGNOSIS — J309 Allergic rhinitis, unspecified: Secondary | ICD-10-CM | POA: Diagnosis not present

## 2021-06-22 ENCOUNTER — Ambulatory Visit (INDEPENDENT_AMBULATORY_CARE_PROVIDER_SITE_OTHER): Payer: Medicare Other | Admitting: *Deleted

## 2021-06-22 DIAGNOSIS — J309 Allergic rhinitis, unspecified: Secondary | ICD-10-CM | POA: Diagnosis not present

## 2021-07-20 ENCOUNTER — Ambulatory Visit (INDEPENDENT_AMBULATORY_CARE_PROVIDER_SITE_OTHER): Payer: Medicare Other

## 2021-07-20 DIAGNOSIS — J309 Allergic rhinitis, unspecified: Secondary | ICD-10-CM

## 2021-08-17 ENCOUNTER — Ambulatory Visit (INDEPENDENT_AMBULATORY_CARE_PROVIDER_SITE_OTHER): Payer: Medicare Other | Admitting: *Deleted

## 2021-08-17 DIAGNOSIS — J309 Allergic rhinitis, unspecified: Secondary | ICD-10-CM

## 2021-09-14 ENCOUNTER — Ambulatory Visit (INDEPENDENT_AMBULATORY_CARE_PROVIDER_SITE_OTHER): Payer: Medicare Other | Admitting: *Deleted

## 2021-09-14 DIAGNOSIS — J309 Allergic rhinitis, unspecified: Secondary | ICD-10-CM

## 2021-09-16 ENCOUNTER — Other Ambulatory Visit: Payer: Self-pay | Admitting: Sports Medicine

## 2021-09-16 ENCOUNTER — Other Ambulatory Visit: Payer: Self-pay

## 2021-09-16 ENCOUNTER — Ambulatory Visit
Admission: RE | Admit: 2021-09-16 | Discharge: 2021-09-16 | Disposition: A | Discharge status: Home / Self Care | Payer: Medicare Other | Source: Ambulatory Visit | Attending: Sports Medicine | Admitting: Sports Medicine

## 2021-09-16 DIAGNOSIS — M5440 Lumbago with sciatica, unspecified side: Secondary | ICD-10-CM

## 2021-09-16 DIAGNOSIS — M25552 Pain in left hip: Secondary | ICD-10-CM

## 2021-09-17 ENCOUNTER — Ambulatory Visit (INDEPENDENT_AMBULATORY_CARE_PROVIDER_SITE_OTHER): Payer: Medicare Other

## 2021-09-17 DIAGNOSIS — J309 Allergic rhinitis, unspecified: Secondary | ICD-10-CM | POA: Diagnosis not present

## 2021-09-21 ENCOUNTER — Ambulatory Visit (INDEPENDENT_AMBULATORY_CARE_PROVIDER_SITE_OTHER): Payer: Medicare Other | Admitting: *Deleted

## 2021-09-21 DIAGNOSIS — J309 Allergic rhinitis, unspecified: Secondary | ICD-10-CM

## 2021-10-19 ENCOUNTER — Ambulatory Visit (INDEPENDENT_AMBULATORY_CARE_PROVIDER_SITE_OTHER): Payer: Medicare Other

## 2021-10-19 DIAGNOSIS — J309 Allergic rhinitis, unspecified: Secondary | ICD-10-CM

## 2021-11-16 ENCOUNTER — Ambulatory Visit (INDEPENDENT_AMBULATORY_CARE_PROVIDER_SITE_OTHER): Payer: Medicare Other | Admitting: *Deleted

## 2021-11-16 DIAGNOSIS — J309 Allergic rhinitis, unspecified: Secondary | ICD-10-CM

## 2021-12-14 ENCOUNTER — Ambulatory Visit (INDEPENDENT_AMBULATORY_CARE_PROVIDER_SITE_OTHER): Payer: Medicare Other | Admitting: *Deleted

## 2021-12-14 DIAGNOSIS — J309 Allergic rhinitis, unspecified: Secondary | ICD-10-CM | POA: Diagnosis not present

## 2022-01-11 ENCOUNTER — Ambulatory Visit (INDEPENDENT_AMBULATORY_CARE_PROVIDER_SITE_OTHER): Payer: Medicare Other

## 2022-01-11 DIAGNOSIS — J309 Allergic rhinitis, unspecified: Secondary | ICD-10-CM | POA: Diagnosis not present

## 2022-01-26 ENCOUNTER — Other Ambulatory Visit: Payer: Self-pay | Admitting: Family Medicine

## 2022-01-26 DIAGNOSIS — Z1231 Encounter for screening mammogram for malignant neoplasm of breast: Secondary | ICD-10-CM

## 2022-02-08 ENCOUNTER — Ambulatory Visit (INDEPENDENT_AMBULATORY_CARE_PROVIDER_SITE_OTHER): Payer: Medicare Other

## 2022-02-08 DIAGNOSIS — J309 Allergic rhinitis, unspecified: Secondary | ICD-10-CM | POA: Diagnosis not present

## 2022-02-23 ENCOUNTER — Ambulatory Visit: Payer: Medicare Other

## 2022-03-08 ENCOUNTER — Ambulatory Visit (INDEPENDENT_AMBULATORY_CARE_PROVIDER_SITE_OTHER): Payer: Medicare Other

## 2022-03-08 DIAGNOSIS — J309 Allergic rhinitis, unspecified: Secondary | ICD-10-CM

## 2022-03-09 ENCOUNTER — Ambulatory Visit
Admission: RE | Admit: 2022-03-09 | Discharge: 2022-03-09 | Disposition: A | Discharge status: Home / Self Care | Payer: Medicare Other | Source: Ambulatory Visit | Attending: Family Medicine | Admitting: Family Medicine

## 2022-03-09 DIAGNOSIS — Z1231 Encounter for screening mammogram for malignant neoplasm of breast: Secondary | ICD-10-CM

## 2022-03-14 ENCOUNTER — Telehealth: Payer: Self-pay

## 2022-03-14 NOTE — Telephone Encounter (Signed)
Vial request for was faxed to [facility] on 03/08/2022. I called today to make sure they received it and to follow up on when it would ship out. The lady took my number and will have someone call me regarding patient's vials. ?

## 2022-03-14 NOTE — Telephone Encounter (Signed)
Nurse called back and stated they did not receive records and asked that I fax it to [phone]. Records have been re-faxed. ?

## 2022-04-05 ENCOUNTER — Ambulatory Visit (INDEPENDENT_AMBULATORY_CARE_PROVIDER_SITE_OTHER): Payer: Medicare Other

## 2022-04-05 DIAGNOSIS — J309 Allergic rhinitis, unspecified: Secondary | ICD-10-CM | POA: Diagnosis not present

## 2022-05-04 ENCOUNTER — Encounter: Payer: Self-pay | Admitting: Allergy and Immunology

## 2022-05-04 ENCOUNTER — Ambulatory Visit (INDEPENDENT_AMBULATORY_CARE_PROVIDER_SITE_OTHER): Payer: Medicare Other | Admitting: Allergy and Immunology

## 2022-05-04 VITALS — BP 142/78 | HR 64 | Resp 16 | Ht 63.5 in | Wt 169.8 lb

## 2022-05-04 DIAGNOSIS — J3089 Other allergic rhinitis: Secondary | ICD-10-CM

## 2022-05-04 DIAGNOSIS — J301 Allergic rhinitis due to pollen: Secondary | ICD-10-CM

## 2022-05-04 NOTE — Progress Notes (Signed)
[place] - [place] - [place] - [place] - [place]   Follow-up Note  Referring Provider: Clayborn Heron, MD Primary Provider: Clayborn Heron, MD Date of Office Visit: 05/04/2022  Subjective:   Valerie Crawford (DOB: 1951-02-19) is a 71 y.o. female who [facility] on 05/04/2022 in re-evaluation of the following:  HPI: Valerie Crawford returns to this clinic in evaluation of allergic rhinoconjunctivitis.  I have not seen her in this clinic since 17 December 2019.  She has been receiving immunotherapy from [facility] administered in our [place] office without any incident currently at every 4 weeks and this therapy has resulted in very good control of her atopic respiratory and eye condition while also using some nasal steroid and occasionally an antihistamine.  Unfortunately, [facility] will not provide her an immunotherapy extract at this point in time.  Allergies as of 05/04/2022       Reactions   Other    Contrast dye - rash   Statins    Flu like syndrome        Medication List    EPINEPHrine 0.3 mg/0.3 mL Soaj injection Commonly known as: EPI-PEN Inject 0.3 mLs (0.3 mg total) into the muscle as needed for anaphylaxis.   fluticasone 50 MCG/ACT nasal spray Commonly known as: FLONASE Place into the nose.   levothyroxine 75 MCG tablet Commonly known as: SYNTHROID TK 1 T PO D   Nexletol 180 MG Tabs Generic drug: Bempedoic Acid Take 1 tablet by mouth daily.    Past Medical History:  Diagnosis Date   Allergies    Thyroid disease     Past Surgical History:  Procedure Laterality Date   TONSILLECTOMY      Review of systems negative except as noted in HPI / PMHx or noted below:  Review of Systems  Constitutional: Negative.   HENT: Negative.    Eyes: Negative.   Respiratory: Negative.    Cardiovascular: Negative.   Gastrointestinal: Negative.   Genitourinary: Negative.    Musculoskeletal: Negative.   Skin: Negative.   Neurological: Negative.   Endo/Heme/Allergies: Negative.   Psychiatric/Behavioral: Negative.       Objective:   Vitals:   05/04/22 0817  BP: (!) 142/78  Pulse: 64  Resp: 16  SpO2: 97%   Height: 5' 3.5" (161.3 cm)  Weight: 169 lb 12.8 oz (77 kg)   Physical Exam Constitutional:      Appearance: She is not diaphoretic.  HENT:     Head: Normocephalic.     Right Ear: Tympanic membrane, ear canal and external ear normal.     Left Ear: Tympanic membrane, ear canal and external ear normal.     Nose: Nose normal. No mucosal edema or rhinorrhea.     Mouth/Throat:     Pharynx: Uvula midline. No oropharyngeal exudate.  Eyes:     Conjunctiva/sclera: Conjunctivae normal.  Neck:     Thyroid: No thyromegaly.     Trachea: Trachea normal. No tracheal tenderness or tracheal deviation.  Cardiovascular:     Rate and Rhythm: Normal rate and regular rhythm.     Heart sounds: Normal heart sounds, S1 normal and S2 normal. No murmur heard. Pulmonary:     Effort: No respiratory distress.     Breath sounds: Normal breath sounds. No stridor. No wheezing or rales.  Lymphadenopathy:     Head:     Right side of head: No tonsillar adenopathy.     Left  side of head: No tonsillar adenopathy.     Cervical: No cervical adenopathy.  Skin:    Findings: No erythema or rash.     Nails: There is no clubbing.  Neurological:     Mental Status: She is alert.     Diagnostics: none  Assessment and Plan:   1. Perennial allergic rhinitis   2. Seasonal allergic rhinitis due to pollen    We will have Valerie Crawford sign a release of information form and send it to [facility] to request the extract formulation so that we can create an immunotherapy extract and restart Valerie Crawford on her immunotherapy.  Laurette Schimke, MD Allergy / Immunology [facility]

## 2022-05-05 ENCOUNTER — Encounter: Payer: Self-pay | Admitting: Allergy and Immunology

## 2022-06-17 NOTE — Telephone Encounter (Signed)
We received medical records as well as patient's immunotherapy prescription. Records have been placed on Dr. Kathyrn Lass desk for him to review and order patients immunotherapy. Please inform me when you send the immunotherapy prescription so I can call patient and get her on the schedule to continue immunotherapy. Thank you!

## 2022-06-21 NOTE — Telephone Encounter (Signed)
Hey Dr. Lucie Leather, have you had the opportunity to review the patients records so that we can make the prescription for her next vials since we are taking over making them?

## 2022-06-24 NOTE — Telephone Encounter (Signed)
Called and spoke with patient and advised that once Dr. Lucie Leather writes the prescription I will have a rush put on her vials so that she can come in and start as soon as possible and I will contact her early next week. Patient verbalized understanding.

## 2022-06-24 NOTE — Telephone Encounter (Signed)
Could you please write the prescription so that we can make the vials? Thank You.

## 2022-06-27 ENCOUNTER — Other Ambulatory Visit: Payer: Self-pay | Admitting: Allergy and Immunology

## 2022-06-27 DIAGNOSIS — J3089 Other allergic rhinitis: Secondary | ICD-10-CM

## 2022-06-28 NOTE — Progress Notes (Signed)
VIALS EXP 06-29-23 

## 2022-06-28 NOTE — Progress Notes (Signed)
Aeroallergen Immunotherapy   Ordering Provider: Dr. Laurette Schimke   Patient Details  Name: KERRIGAN GOMBOS  MRN: [id number]  Date of Birth: 1951-02-08   Order two of two   Vial Label: molds   0.2 ml (Volume)  1:20 Concentration -- Alternaria alternata  0.2 ml (Volume)  1:20 Concentration -- Cladosporium herbarum  0.2 ml (Volume)  1:10 Concentration -- Aspergillus mix  0.2 ml (Volume)  1:10 Concentration -- Penicillium mix  0.2 ml (Volume)  1:20 Concentration -- Bipolaris sorokiniana    1.0  ml Extract Subtotal  4.0  ml Diluent  5.0  ml Maintenance Total   Blue Vial (1:100,000): Schedule C (5 doses)  Yellow Vial (1:10,000): Schedule C (5 doses)  Green Vial (1:1,000): Schedule C (5 doses)  Red Vial (1:100): Schedule C (5 doses)   Special Instructions: 1-2 times per week

## 2022-06-28 NOTE — Progress Notes (Signed)
Aeroallergen Immunotherapy   Ordering Provider: Dr. Laurette Schimke   Patient Details  Name: Valerie Crawford  MRN: [id number]  Date of Birth: [birth year]   Order one of two   Vial Label: dust mite   0.5 ml (Volume)   AU Concentration -- Mite Mix (DF 5,000 & DP 5,000)    0.5  ml Extract Subtotal  4.5  ml Diluent  5.0  ml Maintenance Total   Blue Vial (1:100,000): Schedule C (5 doses)  Yellow Vial (1:10,000): Schedule C (5 doses)  Green Vial (1:1,000): Schedule C (5 doses)  Red Vial (1:100): Schedule C (5 doses)   Special Instructions: 1-2 times per week

## 2022-06-28 NOTE — Telephone Encounter (Signed)
Called and spoke with patient, she has been scheduled for later next week to come in and start new prescriptions.

## 2022-06-29 DIAGNOSIS — J3089 Other allergic rhinitis: Secondary | ICD-10-CM | POA: Diagnosis not present

## 2022-06-30 DIAGNOSIS — J302 Other seasonal allergic rhinitis: Secondary | ICD-10-CM | POA: Diagnosis not present

## 2022-07-07 ENCOUNTER — Ambulatory Visit (INDEPENDENT_AMBULATORY_CARE_PROVIDER_SITE_OTHER): Payer: Medicare Other

## 2022-07-07 DIAGNOSIS — J309 Allergic rhinitis, unspecified: Secondary | ICD-10-CM

## 2022-07-07 NOTE — Progress Notes (Signed)
Immunotherapy   Patient Details  Name: LAQUANTA HUMMEL MRN: [id number] Date of Birth: 1951-06-17  07/07/2022  Theodoro Kos restarted injections for  Mold and Dmite. Patient received 0.05 of both her blue vials with an expiration of 06/29/2023.  Following schedule: C  Frequency:2 times per week Epi-Pen:Epi-Pen Available  Consent signed and patient instructions given.   Dub Mikes 07/07/2022, 9:05 AM

## 2022-07-11 ENCOUNTER — Ambulatory Visit (INDEPENDENT_AMBULATORY_CARE_PROVIDER_SITE_OTHER): Payer: Medicare Other

## 2022-07-11 DIAGNOSIS — J309 Allergic rhinitis, unspecified: Secondary | ICD-10-CM | POA: Diagnosis not present

## 2022-07-14 ENCOUNTER — Ambulatory Visit (INDEPENDENT_AMBULATORY_CARE_PROVIDER_SITE_OTHER): Payer: Medicare Other

## 2022-07-14 DIAGNOSIS — J309 Allergic rhinitis, unspecified: Secondary | ICD-10-CM | POA: Diagnosis not present

## 2022-07-19 ENCOUNTER — Ambulatory Visit (INDEPENDENT_AMBULATORY_CARE_PROVIDER_SITE_OTHER): Payer: Medicare Other

## 2022-07-19 DIAGNOSIS — J309 Allergic rhinitis, unspecified: Secondary | ICD-10-CM | POA: Diagnosis not present

## 2022-07-22 ENCOUNTER — Ambulatory Visit (INDEPENDENT_AMBULATORY_CARE_PROVIDER_SITE_OTHER): Payer: Medicare Other | Admitting: *Deleted

## 2022-07-22 DIAGNOSIS — J309 Allergic rhinitis, unspecified: Secondary | ICD-10-CM

## 2022-07-26 ENCOUNTER — Ambulatory Visit (INDEPENDENT_AMBULATORY_CARE_PROVIDER_SITE_OTHER): Payer: Medicare Other | Admitting: *Deleted

## 2022-07-26 DIAGNOSIS — J309 Allergic rhinitis, unspecified: Secondary | ICD-10-CM

## 2022-07-28 ENCOUNTER — Observation Stay (HOSPITAL_COMMUNITY)
Admission: EM | Admit: 2022-07-28 | Discharge: 2022-07-30 | Disposition: A | Discharge status: Home / Self Care | Payer: Medicare Other | Attending: Family Medicine | Admitting: Family Medicine

## 2022-07-28 ENCOUNTER — Ambulatory Visit (INDEPENDENT_AMBULATORY_CARE_PROVIDER_SITE_OTHER): Payer: Medicare Other | Admitting: *Deleted

## 2022-07-28 ENCOUNTER — Emergency Department (HOSPITAL_COMMUNITY): Payer: Medicare Other

## 2022-07-28 DIAGNOSIS — Z20822 Contact with and (suspected) exposure to covid-19: Secondary | ICD-10-CM | POA: Insufficient documentation

## 2022-07-28 DIAGNOSIS — I10 Essential (primary) hypertension: Secondary | ICD-10-CM | POA: Insufficient documentation

## 2022-07-28 DIAGNOSIS — Z79899 Other long term (current) drug therapy: Secondary | ICD-10-CM | POA: Diagnosis not present

## 2022-07-28 DIAGNOSIS — R7303 Prediabetes: Secondary | ICD-10-CM

## 2022-07-28 DIAGNOSIS — R030 Elevated blood-pressure reading, without diagnosis of hypertension: Secondary | ICD-10-CM | POA: Diagnosis not present

## 2022-07-28 DIAGNOSIS — J309 Allergic rhinitis, unspecified: Secondary | ICD-10-CM

## 2022-07-28 DIAGNOSIS — Z87891 Personal history of nicotine dependence: Secondary | ICD-10-CM | POA: Diagnosis not present

## 2022-07-28 DIAGNOSIS — E039 Hypothyroidism, unspecified: Secondary | ICD-10-CM | POA: Diagnosis not present

## 2022-07-28 DIAGNOSIS — R202 Paresthesia of skin: Secondary | ICD-10-CM | POA: Diagnosis present

## 2022-07-28 DIAGNOSIS — I639 Cerebral infarction, unspecified: Secondary | ICD-10-CM | POA: Diagnosis not present

## 2022-07-28 DIAGNOSIS — G459 Transient cerebral ischemic attack, unspecified: Secondary | ICD-10-CM

## 2022-07-28 LAB — I-STAT CHEM 8, ED
BUN: 15 mg/dL (ref 8–23)
Calcium, Ion: 1.2 mmol/L (ref 1.15–1.40)
Chloride: 103 mmol/L (ref 98–111)
Creatinine, Ser: 0.8 mg/dL (ref 0.44–1.00)
Glucose, Bld: 106 mg/dL — ABNORMAL HIGH (ref 70–99)
HCT: 43 % (ref 36.0–46.0)
Hemoglobin: 14.6 g/dL (ref 12.0–15.0)
Potassium: 3.9 mmol/L (ref 3.5–5.1)
Sodium: 139 mmol/L (ref 135–145)
TCO2: 25 mmol/L (ref 22–32)

## 2022-07-28 LAB — COMPREHENSIVE METABOLIC PANEL
ALT: 30 U/L (ref 0–44)
AST: 29 U/L (ref 15–41)
Albumin: 4.1 g/dL (ref 3.5–5.0)
Alkaline Phosphatase: 54 U/L (ref 38–126)
Anion gap: 10 (ref 5–15)
BUN: 15 mg/dL (ref 8–23)
CO2: 25 mmol/L (ref 22–32)
Calcium: 9.6 mg/dL (ref 8.9–10.3)
Chloride: 103 mmol/L (ref 98–111)
Creatinine, Ser: 0.89 mg/dL (ref 0.44–1.00)
GFR, Estimated: >60 mL/min (ref >60)
Glucose, Bld: 110 mg/dL — ABNORMAL HIGH (ref 70–99)
Potassium: 3.8 mmol/L (ref 3.5–5.1)
Sodium: 138 mmol/L (ref 135–145)
Total Bilirubin: 0.2 mg/dL — ABNORMAL LOW (ref 0.3–1.2)
Total Protein: 6.7 g/dL (ref 6.5–8.1)

## 2022-07-28 LAB — CBC
HCT: 42.8 % (ref 36.0–46.0)
Hemoglobin: 14.1 g/dL (ref 12.0–15.0)
MCH: 29.6 pg (ref 26.0–34.0)
MCHC: 32.9 g/dL (ref 30.0–36.0)
MCV: 89.7 fL (ref 80.0–100.0)
Platelets: 311 10*3/uL (ref 150–400)
RBC: 4.77 MIL/uL (ref 3.87–5.11)
RDW: 13.3 % (ref 11.5–15.5)
WBC: 6.7 10*3/uL (ref 4.0–10.5)
nRBC: 0 % (ref 0.0–0.2)

## 2022-07-28 LAB — DIFFERENTIAL
Abs Immature Granulocytes: 0.02 10*3/uL (ref 0.00–0.07)
Basophils Absolute: 0.1 10*3/uL (ref 0.0–0.1)
Basophils Relative: 1 %
Eosinophils Absolute: 0.2 10*3/uL (ref 0.0–0.5)
Eosinophils Relative: 2 %
Immature Granulocytes: 0 %
Lymphocytes Relative: 41 %
Lymphs Abs: 2.8 10*3/uL (ref 0.7–4.0)
Monocytes Absolute: 0.6 10*3/uL (ref 0.1–1.0)
Monocytes Relative: 8 %
Neutro Abs: 3.1 10*3/uL (ref 1.7–7.7)
Neutrophils Relative %: 48 %

## 2022-07-28 LAB — PROTIME-INR
INR: 0.9 (ref 0.8–1.2)
Prothrombin Time: 11.8 seconds (ref 11.4–15.2)

## 2022-07-28 LAB — RESP PANEL BY RT-PCR (FLU A&B, COVID) ARPGX2
Influenza A by PCR: NEGATIVE
Influenza B by PCR: NEGATIVE
SARS Coronavirus 2 by RT PCR: NEGATIVE

## 2022-07-28 LAB — CBG MONITORING, ED: Glucose-Capillary: 102 mg/dL — ABNORMAL HIGH (ref 70–99)

## 2022-07-28 LAB — APTT: aPTT: 29 seconds (ref 24–36)

## 2022-07-28 LAB — ETHANOL: Alcohol, Ethyl (B): <10 mg/dL (ref <10)

## 2022-07-28 MED ORDER — ASPIRIN 300 MG RE SUPP: 300.0000 mg | Freq: Every day | RECTAL | Status: DC

## 2022-07-28 MED ORDER — ASPIRIN 81 MG PO CHEW
81.0000 mg | CHEWABLE_TABLET | Freq: Every day | ORAL | Status: DC
  Administered 2022-07-29 – 2022-07-30 (×2): 81 mg via ORAL
  Filled 2022-07-28 (×2): qty 1

## 2022-07-28 MED ORDER — CLOPIDOGREL BISULFATE 75 MG PO TABS
75.0000 mg | ORAL_TABLET | Freq: Every day | ORAL | Status: DC
  Administered 2022-07-29 – 2022-07-30 (×2): 75 mg via ORAL
  Filled 2022-07-28 (×2): qty 1

## 2022-07-28 NOTE — Code Documentation (Signed)
Stroke Response Nurse Documentation Code Documentation  Valerie Crawford is a 71 y.o. female arriving to [facility]  via [facility] on 9/14 with past medical hx of thyroid disease. On No antithrombotic. Code stroke was activated by ED.   Patient from home where she was LKW at 2015 and now complaining of left sided weakness.  Stroke team at the bedside on patient arrival. Labs drawn and patient cleared for CT by Dr. Manus Gunning. Patient to CT with team. NIHSS 0, see documentation for details and code stroke times. Patient with no deficits on exam. The following imaging was completed:  CT Head. Patient is not a candidate for IV Thrombolytic due to resolving symptoms. Patient is not a candidate for IR due to low suspicion for LVO.   Care Plan: TIA alert.   Bedside handoff with ED RN.    Rose Fillers  Rapid Response RN

## 2022-07-28 NOTE — ED Triage Notes (Signed)
Pt brought to ED by [facility] with possible TIA. EMS states that approximately 2015 pt had sudden onset of slurred speech and left sided weakness that last for approximately 5 minutes. Repeat event happened approximately 2045 that lasted for 5 minutes.    EMS Vitals  BP 228/140 HR 68 SPO2 100% RA CBG 111

## 2022-07-28 NOTE — ED Provider Triage Note (Signed)
Emergency Medicine Provider Triage Evaluation Note  Valerie Crawford , a 71 y.o. female  was evaluated in triage.  Pt complains of 2 episodes of left upper extremity and lower extremity numbness and tingling, difficulty speaking, severe headache and confusion.  Initial episode was at 2015 tonight, lasted 5 minutes, subsequent episode at 2045 again lasted approximately 15 minutes.  Per patient she is feeling mostly normal at this time.  No history of CVA.  Review of Systems  Positive: Slurred speech, paresthesias, headache Negative: Chest pain, blurry double vision  Physical Exam  BP (!) 196/80   Pulse 71   Temp 97.9 F (36.6 C)   Resp 18   SpO2 98%  Gen:   Awake, no distress   Resp:  Normal effort  MSK:   Moves extremities without difficulty  Other:  Subtle decrease in sensation in the left upper extremity compared to the right.  Patient intermittently slurring her speech mildly, repetitive speech  Medical Decision Making  Medically screening exam initiated at 10:21 PM.  Appropriate orders placed.  Valerie Crawford was informed that the remainder of the evaluation will be completed by another provider, this initial triage assessment does not replace that evaluation, and the importance of remaining in the ED until their evaluation is complete.  Code stroke activated for any age of 3 and patient with multiple episodes concerning for possible TIA tonight. Charge RN aware, code stroke activated.   This chart was dictated using voice recognition software, Dragon. Despite the best efforts of this provider to proofread and correct errors, errors may still occur which can change documentation meaning.     Valerie Lore, PA-C 07/28/22 2223

## 2022-07-28 NOTE — Consult Note (Signed)
NEUROLOGY CONSULTATION NOTE   Date of service: July 28, 2022 Patient Name: Valerie Crawford MRN:  [id number] DOB:  1951-10-23 Reason for consult: "stroke code for recurrence " Requesting Provider: Glynn Octave, MD _ _ _   _ __   _ __ _ _  __ __   _ __   __ _  History of Present Illness  Valerie Crawford is a 71 y.o. female with PMH significant for hypothyroidism who presents with sudden onset slurred speech, tingling in LUE and then spread to LLE extremity with left sided weakness. EMS called and symptoms resolved enroute. She had recurrence of her symptoms again in the ED and this time, lasted about 20 mins. Symptoms had resolved by the time I arrived and examined her.  No prior history of strokes or TIAs.  Endorses family history of strokes.  No history of diabetes, no history of hypertension.  Does endorse history of hyperlipidemia but was unable to tolerate statins.  Endorses mild pressure on the left side of her head but not a headache. No prior hx of similar symptoms.  Patient also endorses allergy to contrast dye but is unsure if she is allergic to gadolinium based contrast or to iodinated contrast.  Reports she developed itchiness and rash when she had the contrast and had to be given a course of steroids twice and lasting a week each time before the itchiness and the rash would go away.  LKW: 2015 on 07/28/2022. mRS: 0 tNKASE: Not offered due to resolution of symptoms. Thrombectomy: Not offered due to low suspicion for LVO in her resolution of symptoms. NIHSS components Score: Comment  1a Level of Conscious 0[x]  1[]  2[]  3[]      1b LOC Questions 0[x]  1[]  2[]       1c LOC Commands 0[x]  1[]  2[]       2 Best Gaze 0[x]  1[]  2[]       3 Visual 0[x]  1[]  2[]  3[]      4 Facial Palsy 0[x]  1[]  2[]  3[]      5a Motor Arm - left 0[x]  1[]  2[]  3[]  4[]  UN[]    5b Motor Arm - Right 0[x]  1[]  2[]  3[]  4[]  UN[]    6a Motor Leg - Left 0[x]  1[]  2[]  3[]  4[]  UN[]    6b Motor Leg - Right 0[x]  1[]  2[]  3[]   4[]  UN[]    7 Limb Ataxia 0[x]  1[]  2[]  3[]  UN[]     8 Sensory 0[x]  1[]  2[]  UN[]      9 Best Language 0[x]  1[]  2[]  3[]      10 Dysarthria 0[x]  1[]  2[]  UN[]      11 Extinct. and Inattention 0[x]  1[]  2[]       TOTAL: 0       ROS   Constitutional Denies weight loss, fever and chills.   HEENT Denies changes in vision and hearing.   Respiratory Denies SOB and cough.   CV Denies palpitations and CP   GI Denies abdominal pain, nausea, vomiting and diarrhea.   GU Denies dysuria and urinary frequency.   MSK Denies myalgia and joint pain.   Skin Denies rash and pruritus.   Neurological Denies headache and syncope.   Psychiatric Denies recent changes in mood. Denies anxiety and depression.    Past History   Past Medical History:  Diagnosis Date   Allergies    Thyroid disease    Past Surgical History:  Procedure Laterality Date   TONSILLECTOMY     Family History  Problem Relation Age of Onset   Hyperlipidemia Mother  Arthritis Father    Social History   Socioeconomic History   Marital status: Divorced    Spouse name: Not on file   Number of children: Not on file   Years of education: Not on file   Highest education level: Not on file  Occupational History   Not on file  Tobacco Use   Smoking status: Former    Types: Cigarettes    Quit date: 12/16/1968    Years since quitting: 53.6   Smokeless tobacco: Former  Building services engineer Use: Never used  Substance and Sexual Activity   Alcohol use: Not Currently   Drug use: Never   Sexual activity: Not on file  Other Topics Concern   Not on file  Social History Narrative   Not on file   Social Determinants of Health   Financial Resource Strain: Not on file  Food Insecurity: Not on file  Transportation Needs: Not on file  Physical Activity: Not on file  Stress: Not on file  Social Connections: Not on file   Allergies  Allergen Reactions   Other     Contrast dye - rash   Statins     Flu like syndrome     Medications  (Not in a hospital admission)    Vitals   Vitals:   07/28/22 2218 07/28/22 2238 07/28/22 2239  BP: (!) 196/80 (!) 168/92   Pulse: 71 63   Resp: 18 16   Temp: 97.9 F (36.6 C)    SpO2: 98% 97%   Weight:   80 kg  Height:   5\' 5"  (1.651 m)     Body mass index is 29.35 kg/m.  Physical Exam   General: Laying comfortably in bed; in no acute distress.  HENT: Normal oropharynx and mucosa. Normal external appearance of ears and nose.  Neck: Supple, no pain or tenderness  CV: No JVD. No peripheral edema.  Pulmonary: Symmetric Chest rise. Normal respiratory effort.  Abdomen: Soft to touch, non-tender.  Ext: No cyanosis, edema, or deformity  Skin: No rash. Normal palpation of skin.   Musculoskeletal: Normal digits and nails by inspection. No clubbing.   Neurologic Examination  Mental status/Cognition: Alert, oriented to self, place, month and year, good attention.  Speech/language: Fluent, comprehension intact, object naming intact, repetition intact.  Cranial nerves:   CN II Pupils equal and reactive to light, no VF deficits    CN III,IV,VI EOM intact, no gaze preference or deviation, no nystagmus    CN V normal sensation in V1, V2, and V3 segments bilaterally    CN VII no asymmetry, no nasolabial fold flattening    CN VIII normal hearing to speech    CN IX & X normal palatal elevation, no uvular deviation    CN XI 5/5 head turn and 5/5 shoulder shrug bilaterally    CN XII midline tongue protrusion    Motor:  Muscle bulk: normal, tone normal, pronator drift none tremor none Mvmt Root Nerve  Muscle Right Left Comments  SA C5/6 Ax Deltoid 5 5   EF C5/6 Mc Biceps 5 5   EE C6/7/8 Rad Triceps 5 5   WF C6/7 Med FCR     WE C7/8 PIN ECU     F Ab C8/T1 U ADM/FDI 5 5   HF L1/2/3 Fem Illopsoas 5 5   KE L2/3/4 Fem Quad 5 5   DF L4/5 D Peron Tib Ant 5 5   PF S1/2 Tibial Grc/Sol 5 5  Reflexes:  Right Left Comments  Pectoralis      Biceps (C5/6) 2 2    Brachioradialis (C5/6) 2 2    Triceps (C6/7) 2 2    Patellar (L3/4) 2 2    Achilles (S1)      Hoffman      Plantar     Jaw jerk    Sensation:  Light touch Intact throughout   Pin prick    Temperature    Vibration   Proprioception    Coordination/Complex Motor:  - Finger to Nose intact bilaterally - Heel to shin intact bilaterally - Rapid alternating movement are normal - Gait: Deferred. Labs   CBC:  Recent Labs  Lab 07/28/22 2224 07/28/22 2236  WBC 6.7  --   NEUTROABS 3.1  --   HGB 14.1 14.6  HCT 42.8 43.0  MCV 89.7  --   PLT 311  --     Basic Metabolic Panel:  Lab Results  Component Value Date   NA 139 07/28/2022   K 3.9 07/28/2022   CO2 25 07/28/2022   GLUCOSE 106 (H) 07/28/2022   BUN 15 07/28/2022   CREATININE 0.80 07/28/2022   CALCIUM 9.6 07/28/2022   GFRNONAA >60 07/28/2022   Lipid Panel: No results found for: "[place]" HgbA1c: No results found for: "HGBA1C" Urine Drug Screen: No results found for: "LABOPIA", "COCAINSCRNUR", "LABBENZ", "AMPHETMU", "THCU", "LABBARB"  Alcohol Level     Component Value Date/Time   ETH <10 07/28/2022 2224    CT Head without contrast(Personally reviewed): CTH was negative for a large hypodensity concerning for a large territory infarct or hyperdensity concerning for an ICH  MR Angio head without contrast and MR angio neck without contrast:  Pending  MRI Brain: pending  Impression   Valerie Crawford is a 71 y.o. female with PMH significant for hypothyroidism who presents with sudden onset slurred speech, tingling in LUE and then spread to LLE extremity with left sided weakness. EMS called and symptoms resolved enroute. She had recurrence of her symptoms again in the ED and this time, lasted about 20 mins. Symptoms had resolved by the time I arrived and examined her.  Presentation concerning for a stuttering lacunar stroke versus a TIA.  Primary Diagnosis:  Cerebral infarction, unspecified.   Recommendations     - Frequent Neuro checks per stroke unit protocol - Recommend brain imaging with MRI Brain without contrast - Recommend Vascular imaging with MRA Angio Head without contrast and MR Angio neck without contrast. - Recommend obtaining TTE - Recommend obtaining Lipid panel with LDL - Please start statin if LDL > 70 - Recommend HbA1c - Antithrombotic -aspirin 81 mg daily along with Plavix 75 mg daily for 21 days, followed by aspirin 81 mg daily alone. - Recommend DVT ppx - SBP goal - permissive hypertension first 24 h < 220/110. Held home meds.  - Recommend Telemetry monitoring for arrythmia - Recommend bedside swallow screen prior to PO intake. - Stroke education booklet - Recommend PT/OT/SLP consult  ______________________________________________________________________   Thank you for the opportunity to take part in the care of this patient. If you have any further questions, please contact the neurology consultation attending.  Signed,  [place] Pager Number [phone] _ _ _   _ __   _ __ _ _  __ __   _ __   __ _

## 2022-07-28 NOTE — ED Provider Notes (Signed)
[facility] EMERGENCY DEPARTMENT Provider Note   CSN: [id number] Arrival date & time: 07/28/22  2208     History  Chief Complaint  Patient presents with   Transient Ischemic Attack    Valerie Crawford is a 71 y.o. female.  Patient presents from triage as code stroke.  Complains of left upper extremity tingling and left lower extremity tingling difficulty speaking with headache and confusion.  First episode occurred around 8:15 PM lasted about 5 minutes and resolved.  Second episode occurred at 8:45 PM and lasted 15 minutes and is "almost resolved."  States she still has some feeling of not quite right to her left arm and her left leg with difficulty speaking.  Denies any chest pain, shortness of breath, abdominal pain, nausea, vomiting, cough or fever.  Denies any blood thinner use.  Denies any antihypertensive use  The history is provided by the patient and the EMS personnel. The history is limited by the condition of the patient.       Home Medications Prior to Admission medications   Medication Sig Start Date End Date Taking? Authorizing Provider  Bempedoic Acid (NEXLETOL) 180 MG TABS Take 1 tablet by mouth daily. 01/09/20   Hilty, Lisette Abu, MD  EPINEPHrine 0.3 mg/0.3 mL IJ SOAJ injection Inject 0.3 mLs (0.3 mg total) into the muscle as needed for anaphylaxis. 05/28/19   Kozlow, Alvira Philips, MD  fluticasone (FLONASE) 50 MCG/ACT nasal spray Place into the nose.    [provider]  levothyroxine (SYNTHROID) 75 MCG tablet TK 1 T PO D 08/02/18   [provider]      Allergies    Other and Statins    Review of Systems   Review of Systems  Unable to perform ROS: Acuity of condition    Physical Exam Updated Vital Signs BP (!) 196/80   Pulse 71   Temp 97.9 F (36.6 C)   Resp 18   SpO2 98%  Physical Exam Vitals and nursing note reviewed.  Constitutional:      General: She is not in acute distress.    Appearance: She is well-developed.  HENT:      Head: Normocephalic and atraumatic.     Mouth/Throat:     Pharynx: No oropharyngeal exudate.  Eyes:     Conjunctiva/sclera: Conjunctivae normal.     Pupils: Pupils are equal, round, and reactive to light.  Neck:     Comments: No meningismus. Cardiovascular:     Rate and Rhythm: Normal rate and regular rhythm.     Heart sounds: Normal heart sounds. No murmur heard. Pulmonary:     Effort: Pulmonary effort is normal. No respiratory distress.     Breath sounds: Normal breath sounds.  Abdominal:     Palpations: Abdomen is soft.     Tenderness: There is no abdominal tenderness. There is no guarding or rebound.  Musculoskeletal:        General: No tenderness. Normal range of motion.     Cervical back: Normal range of motion and neck supple.  Skin:    General: Skin is warm.  Neurological:     Mental Status: She is alert and oriented to person, place, and time.     Cranial Nerves: No cranial nerve deficit.     Motor: No abnormal muscle tone.     Coordination: Coordination normal.     Comments: No ataxia on finger to nose bilaterally. No pronator drift. 5/5 strength throughout. CN 2-12 intact.Equal grip strength. Sensation  intact.  Decreased sensation to left arm and left leg subjectively.  Intermittent slurred speech but no apparent aphasia.  Psychiatric:        Behavior: Behavior normal.     ED Results / Procedures / Treatments   Labs (all labs ordered are listed, but only abnormal results are displayed) Labs Reviewed  COMPREHENSIVE METABOLIC PANEL - Abnormal; Notable for the following components:      Result Value   Glucose, Bld 110 (*)    Total Bilirubin 0.2 (*)    All other components within normal limits  CBG MONITORING, ED - Abnormal; Notable for the following components:   Glucose-Capillary 102 (*)    All other components within normal limits  I-STAT CHEM 8, ED - Abnormal; Notable for the following components:   Glucose, Bld 106 (*)    All other components within  normal limits  RESP PANEL BY RT-PCR (FLU A&B, COVID) ARPGX2  ETHANOL  PROTIME-INR  APTT  CBC  DIFFERENTIAL  RAPID URINE DRUG SCREEN, HOSP PERFORMED  URINALYSIS, ROUTINE W REFLEX MICROSCOPIC  LIPID PANEL  HEMOGLOBIN A1C    EKG EKG Interpretation  Date/Time:  Thursday July 28 2022 22:57:09 EDT Ventricular Rate:  62 PR Interval:  185 QRS Duration: 101 QT Interval:  412 QTC Calculation: 419 R Axis:   60 Text Interpretation: Sinus rhythm No significant change was found Confirmed by Glynn Octave [phone]) on 07/28/2022 11:03:31 PM  Radiology CT HEAD CODE STROKE WO CONTRAST  Result Date: 07/28/2022 CLINICAL DATA:  Code stroke.  Left upper extremity paresthesia EXAM: CT HEAD WITHOUT CONTRAST TECHNIQUE: Contiguous axial images were obtained from the base of the skull through the vertex without intravenous contrast. RADIATION DOSE REDUCTION: This exam was performed according to the departmental dose-optimization program which includes automated exposure control, adjustment of the mA and/or kV according to patient size and/or use of iterative reconstruction technique. COMPARISON:  None Available. FINDINGS: Brain: There is no mass, hemorrhage or extra-axial collection. The size and configuration of the ventricles and extra-axial CSF spaces are normal. The brain parenchyma is normal, without evidence of acute or chronic infarction. Vascular: No abnormal hyperdensity of the major intracranial arteries or dural venous sinuses. No intracranial atherosclerosis. Skull: The visualized skull base, calvarium and extracranial soft tissues are normal. Sinuses/Orbits: No fluid levels or advanced mucosal thickening of the visualized paranasal sinuses. No mastoid or middle ear effusion. The orbits are normal. ASPECTS [facility] Stroke Program Early CT Score) - Ganglionic level infarction (caudate, lentiform nuclei, internal capsule, insula, M1-M3 cortex): 7 - Supraganglionic infarction (M4-M6 cortex): 3 Total  score (0-10 with 10 being normal): 10 IMPRESSION: 1. No acute intracranial abnormality. 2. ASPECTS is 10. These results were communicated to Dr. Erick Blinks at 10:35 pm on 07/28/2022 by text page via the [facility] messaging system. Electronically Signed   By: Deatra Robinson M.D.   On: 07/28/2022 22:36    Procedures Procedures    Medications Ordered in ED Medications - No data to display  ED Course/ Medical Decision Making/ A&P                           Medical Decision Making Amount and/or Complexity of Data Reviewed Independent Historian: EMS Labs: ordered. Decision-making details documented in ED Course. Radiology: ordered and independent interpretation performed. Decision-making details documented in ED Course.  Risk Decision regarding hospitalization.  Patient presents as code stroke with left-sided numbness and tingling onset this evening x2 separate episodes.  Symptoms  improving.  Code stroke was activated in triage.  Seen on arrival with Dr. Derry Lory of neurology. CT head negative for hemorrhage.  Results reviewed and interpreted by me  Symptoms rapidly improving.  Neurology does not feel she is a tPA candidate given rapid improvement.  Recommends MRI and MRA.  Patient has unclear allergy to contrast and is not sure if it is for CT or MRI contrast.   Electrolytes are reassuring.  Blood pressure has improved spontaneously without medications.  Will allow permissive hypertension.  Admission for stroke work-up discussed with Dr. Loney Loh.        Final Clinical Impression(s) / ED Diagnoses Final diagnoses:  Paresthesias    Rx / DC Orders ED Discharge Orders     None         Lonni Dirden, Jeannett Senior, MD 07/29/22 0001

## 2022-07-29 ENCOUNTER — Ambulatory Visit (HOSPITAL_BASED_OUTPATIENT_CLINIC_OR_DEPARTMENT_OTHER): Payer: Medicare Other

## 2022-07-29 ENCOUNTER — Encounter (HOSPITAL_COMMUNITY): Payer: Self-pay | Admitting: Internal Medicine

## 2022-07-29 ENCOUNTER — Other Ambulatory Visit: Payer: Self-pay

## 2022-07-29 ENCOUNTER — Observation Stay (HOSPITAL_COMMUNITY): Payer: Medicare Other

## 2022-07-29 DIAGNOSIS — I6389 Other cerebral infarction: Secondary | ICD-10-CM | POA: Diagnosis not present

## 2022-07-29 DIAGNOSIS — R7303 Prediabetes: Secondary | ICD-10-CM

## 2022-07-29 DIAGNOSIS — I639 Cerebral infarction, unspecified: Secondary | ICD-10-CM

## 2022-07-29 DIAGNOSIS — R030 Elevated blood-pressure reading, without diagnosis of hypertension: Secondary | ICD-10-CM

## 2022-07-29 DIAGNOSIS — E039 Hypothyroidism, unspecified: Secondary | ICD-10-CM

## 2022-07-29 LAB — URINALYSIS, ROUTINE W REFLEX MICROSCOPIC
Bilirubin Urine: NEGATIVE
Glucose, UA: NEGATIVE mg/dL
Hgb urine dipstick: NEGATIVE
Ketones, ur: NEGATIVE mg/dL
Nitrite: NEGATIVE
Protein, ur: NEGATIVE mg/dL
Specific Gravity, Urine: 1.011 (ref 1.005–1.030)
pH: 7 (ref 5.0–8.0)

## 2022-07-29 LAB — ECHOCARDIOGRAM COMPLETE BUBBLE STUDY
AR max vel: 2.14 cm2
AV Area VTI: 2.14 cm2
AV Area mean vel: 2.14 cm2
AV Mean grad: 4 mmHg
AV Peak grad: 7.8 mmHg
Ao pk vel: 1.4 m/s
Area-P 1/2: 3.03 cm2
S' Lateral: 2.4 cm

## 2022-07-29 LAB — RAPID URINE DRUG SCREEN, HOSP PERFORMED
Amphetamines: NOT DETECTED
Barbiturates: NOT DETECTED
Benzodiazepines: NOT DETECTED
Cocaine: NOT DETECTED
Opiates: NOT DETECTED
Tetrahydrocannabinol: NOT DETECTED

## 2022-07-29 LAB — HEMOGLOBIN A1C
Hgb A1c MFr Bld: 5.7 % — ABNORMAL HIGH (ref 4.8–5.6)
Mean Plasma Glucose: 116.89 mg/dL

## 2022-07-29 LAB — LIPID PANEL
Cholesterol: 248 mg/dL — ABNORMAL HIGH (ref 0–200)
HDL: 54 mg/dL (ref >40)
LDL Cholesterol: 155 mg/dL — ABNORMAL HIGH (ref 0–99)
Total CHOL/HDL Ratio: 4.6 RATIO
Triglycerides: 194 mg/dL — ABNORMAL HIGH (ref <150)
VLDL: 39 mg/dL (ref 0–40)

## 2022-07-29 MED ORDER — LEVOTHYROXINE SODIUM 75 MCG PO TABS
75.0000 ug | ORAL_TABLET | Freq: Every day | ORAL | Status: DC
  Administered 2022-07-29 – 2022-07-30 (×2): 75 ug via ORAL
  Filled 2022-07-29 (×2): qty 1

## 2022-07-29 MED ORDER — ACETAMINOPHEN 325 MG PO TABS
650.0000 mg | ORAL_TABLET | Freq: Four times a day (QID) | ORAL | Status: DC | PRN
  Filled 2022-07-29: qty 2

## 2022-07-29 MED ORDER — STROKE: EARLY STAGES OF RECOVERY BOOK: Freq: Once | Status: DC

## 2022-07-29 MED ORDER — ACETAMINOPHEN 650 MG RE SUPP: 650.0000 mg | Freq: Four times a day (QID) | RECTAL | Status: DC | PRN

## 2022-07-29 MED ORDER — IBUPROFEN 400 MG PO TABS
400.0000 mg | ORAL_TABLET | ORAL | Status: DC | PRN
  Administered 2022-07-29 – 2022-07-30 (×2): 400 mg via ORAL
  Filled 2022-07-29 (×2): qty 1

## 2022-07-29 MED ORDER — ENOXAPARIN SODIUM 40 MG/0.4ML IJ SOSY
40.0000 mg | PREFILLED_SYRINGE | INTRAMUSCULAR | Status: DC
  Administered 2022-07-29 – 2022-07-30 (×2): 40 mg via SUBCUTANEOUS
  Filled 2022-07-29 (×2): qty 0.4

## 2022-07-29 MED ORDER — EZETIMIBE 10 MG PO TABS
10.0000 mg | ORAL_TABLET | Freq: Every day | ORAL | Status: DC
  Filled 2022-07-29 (×2): qty 1

## 2022-07-29 NOTE — ED Notes (Signed)
Patient transported to MRI 

## 2022-07-29 NOTE — Progress Notes (Addendum)
Subjective: Patient admitted this morning, see detailed H&P by Dr Loney Loh 71 year old female with medical history of hypothyroidism, allergic rhinitis came to ED for evaluation of sudden onset slurred speech and left-sided weakness/paresthesia.  Patient was hypertensive on presentation with blood pressure 228/140.  Symptoms had resolved in route.  She was not found to be a tPA candidate as the symptoms had improved.  Hemoglobin A1c 5.7.  Patient states tonight around 8:15 PM she had just finished a Zoom meeting and was taking some notes when she noticed she was having difficulty writing.  She is left-handed.  She experienced tingling in her left arm.  When she tried to get up, she felt lightheaded.  Symptoms lasted about 5 minutes and resolved.  She then called her daughter and as she was waiting for EMS to arrive, she had recurrence of symptoms and this time her tongue felt heavy and speech was slurred.  Her left leg was tingling and she had difficulty walking.  Symptoms resolved in about 10 minutes.  She denies history of strokes in the past.  Reports history of hyperlipidemia but has not been able to tolerate to 3 or 4 different statins in the past due to severe myalgias/flulike symptoms.  She does not smoke cigarettes.  Denies history of hypertension.  No other complaints.  Neurology was consulted. Patient's symptoms have resolved. MRI brain shows small focus of acute ischemia along the posterior limb of the right internal capsule.  Vitals:   07/29/22 0633 07/29/22 0700  BP: 124/80 (!) 141/69  Pulse: 70 60  Resp: 19 16  Temp:    SpO2: 97% 97%      A/P  Stroke  -Patient's symptoms have completely resolved however MRI brain shows area of small focus of acute ischemia along the posterior limb of the right internal capsule -Blood pressure was elevated at presentation; 228/140 -Stroke work-up in process -Continue aspirin and Plavix -LDL 155; patient was intolerant of statins in the past.   Started on Zetia -MRA of head and neck is normal -Follow echocardiogram results -We will await neurology recommendations  Hypertension -Blood pressure was elevated at presentation -Blood pressure has significantly improved -Patient will need antihypertensive medications on discharge  Hypothyroidism -Continue Synthroid     Meredeth Ide [facility]

## 2022-07-29 NOTE — ED Notes (Signed)
RN notified provider about diet order.

## 2022-07-29 NOTE — H&P (Signed)
History and Physical    Valerie Crawford MLY:[id number] DOB: 10/25/1951 DOA: 07/28/2022  PCP: Clayborn Heron, MD  Patient coming from: Home  Chief Complaint: Slurred speech  HPI: Valerie Crawford is a 71 y.o. female with medical history significant of hypothyroidism, allergic rhinitis presenting the ED as code stroke for evaluation of sudden onset slurred speech and left-sided weakness/paresthesias.  Hypertensive with EMS with blood pressure 228/140.  Symptoms resolved in route.  She had recurrence of symptoms again in the ED which lasted about 20 minutes and resolved. CT head negative for acute finding.  Given rapid improvement of symptoms, neurology did not feel that she was a tPA candidate.  Felt that her presentation was concerning for stuttering lacunar stroke versus TIA.  Patient has contrast allergy, neurology recommended further evaluation with brain MRI without contrast and MRA head and neck without contrast.  No significant lab abnormalities on work-up except abnormal lipid panel and A1c 5.7.  Patient states tonight around 8:15 PM she had just finished a Zoom meeting and was taking some notes when she noticed she was having difficulty writing.  She is left-handed.  She experienced tingling in her left arm.  When she tried to get up, she felt lightheaded.  Symptoms lasted about 5 minutes and resolved.  She then called her daughter and as she was waiting for EMS to arrive, she had recurrence of symptoms and this time her tongue felt heavy and speech was slurred.  Her left leg was tingling and she had difficulty walking.  Symptoms resolved in about 10 minutes.  She denies history of strokes in the past.  Reports history of hyperlipidemia but has not been able to tolerate to 3 or 4 different statins in the past due to severe myalgias/flulike symptoms.  She does not smoke cigarettes.  Denies history of hypertension.  No other complaints.  Review of Systems:  Review of Systems  All other  systems reviewed and are negative.   Past Medical History:  Diagnosis Date   Allergies    Thyroid disease     Past Surgical History:  Procedure Laterality Date   TONSILLECTOMY       reports that she quit smoking about 53 years ago. Her smoking use included cigarettes. She has quit using smokeless tobacco. She reports that she does not currently use alcohol. She reports that she does not use drugs.  Allergies  Allergen Reactions   Other     Contrast dye - rash   Statins     Flu like syndrome    Family History  Problem Relation Age of Onset   Hyperlipidemia Mother    Arthritis Father     Prior to Admission medications   Medication Sig Start Date End Date Taking? Authorizing Provider  fluticasone (FLONASE) 50 MCG/ACT nasal spray Place 1-2 sprays into the nose at bedtime.   Yes [provider]  levothyroxine (SYNTHROID) 75 MCG tablet Take 75 mcg by mouth daily before breakfast. 08/02/18  Yes [provider]  EPINEPHrine 0.3 mg/0.3 mL IJ SOAJ injection Inject 0.3 mLs (0.3 mg total) into the muscle as needed for anaphylaxis. Patient not taking: Reported on 07/29/2022 05/28/19   Jessica Priest, MD    Physical Exam: Vitals:   07/28/22 2239 07/29/22 0015 07/29/22 0045 07/29/22 0100  BP:  (!) 138/96 (!) 148/86 (!) 144/94  Pulse:  61 (!) 58 60  Resp:  18 16 19   Temp:      SpO2:  98% 97% 98%  Weight: 80 kg     Height: 5\' 5"  (1.651 m)       Physical Exam Vitals reviewed.  Constitutional:      General: She is not in acute distress. HENT:     Head: Normocephalic and atraumatic.  Eyes:     Extraocular Movements: Extraocular movements intact.  Cardiovascular:     Rate and Rhythm: Normal rate and regular rhythm.     Pulses: Normal pulses.  Pulmonary:     Effort: Pulmonary effort is normal. No respiratory distress.     Breath sounds: Normal breath sounds.  Abdominal:     General: Bowel sounds are normal. There is no distension.     Palpations: Abdomen is  soft.     Tenderness: There is no abdominal tenderness.  Musculoskeletal:        General: No swelling or tenderness.     Cervical back: Normal range of motion.  Skin:    General: Skin is warm and dry.  Neurological:     General: No focal deficit present.     Mental Status: She is alert and oriented to person, place, and time.     Cranial Nerves: No cranial nerve deficit.     Sensory: No sensory deficit.     Motor: No weakness.      Labs on Admission: I have personally reviewed following labs and imaging studies  CBC: Recent Labs  Lab 07/28/22 2224 07/28/22 2236  WBC 6.7  --   NEUTROABS 3.1  --   HGB 14.1 14.6  HCT 42.8 43.0  MCV 89.7  --   PLT 311  --    Basic Metabolic Panel: Recent Labs  Lab 07/28/22 2224 07/28/22 2236  NA 138 139  K 3.8 3.9  CL 103 103  CO2 25  --   GLUCOSE 110* 106*  BUN 15 15  CREATININE 0.89 0.80  CALCIUM 9.6  --    GFR: Estimated Creatinine Clearance: 66.4 mL/min (by C-G formula based on SCr of 0.8 mg/dL). Liver Function Tests: Recent Labs  Lab 07/28/22 2224  AST 29  ALT 30  ALKPHOS 54  BILITOT 0.2*  PROT 6.7  ALBUMIN 4.1   No results for input(s): "LIPASE", "AMYLASE" in the last 168 hours. No results for input(s): "AMMONIA" in the last 168 hours. Coagulation Profile: Recent Labs  Lab 07/28/22 2224  INR 0.9   Cardiac Enzymes: No results for input(s): "CKTOTAL", "CKMB", "CKMBINDEX", "TROPONINI" in the last 168 hours. BNP (last 3 results) No results for input(s): "PROBNP" in the last 8760 hours. HbA1C: Recent Labs    07/28/22 2224  HGBA1C 5.7*   CBG: Recent Labs  Lab 07/28/22 2215  GLUCAP 102*   Lipid Profile: Recent Labs    07/28/22 2224  CHOL 248*  HDL 54  LDLCALC 155*  TRIG 194*  CHOLHDL 4.6   Thyroid Function Tests: No results for input(s): "TSH", "T4TOTAL", "FREET4", "T3FREE", "THYROIDAB" in the last 72 hours. Anemia Panel: No results for input(s): "VITAMINB12", "FOLATE", "FERRITIN", "TIBC",  "IRON", "RETICCTPCT" in the last 72 hours. Urine analysis: No results found for: "COLORURINE", "APPEARANCEUR", "LABSPEC", "PHURINE", "GLUCOSEU", "HGBUR", "BILIRUBINUR", "KETONESUR", "PROTEINUR", "UROBILINOGEN", "NITRITE", "LEUKOCYTESUR"  Radiological Exams on Admission: I have personally reviewed images CT HEAD CODE STROKE WO CONTRAST  Result Date: 07/28/2022 CLINICAL DATA:  Code stroke.  Left upper extremity paresthesia EXAM: CT HEAD WITHOUT CONTRAST TECHNIQUE: Contiguous axial images were obtained from the base of the skull through the vertex without intravenous contrast. RADIATION DOSE REDUCTION: This exam  was performed according to the departmental dose-optimization program which includes automated exposure control, adjustment of the mA and/or kV according to patient size and/or use of iterative reconstruction technique. COMPARISON:  None Available. FINDINGS: Brain: There is no mass, hemorrhage or extra-axial collection. The size and configuration of the ventricles and extra-axial CSF spaces are normal. The brain parenchyma is normal, without evidence of acute or chronic infarction. Vascular: No abnormal hyperdensity of the major intracranial arteries or dural venous sinuses. No intracranial atherosclerosis. Skull: The visualized skull base, calvarium and extracranial soft tissues are normal. Sinuses/Orbits: No fluid levels or advanced mucosal thickening of the visualized paranasal sinuses. No mastoid or middle ear effusion. The orbits are normal. ASPECTS [facility] Stroke Program Early CT Score) - Ganglionic level infarction (caudate, lentiform nuclei, internal capsule, insula, M1-M3 cortex): 7 - Supraganglionic infarction (M4-M6 cortex): 3 Total score (0-10 with 10 being normal): 10 IMPRESSION: 1. No acute intracranial abnormality. 2. ASPECTS is 10. These results were communicated to Dr. Erick Blinks at 10:35 pm on 07/28/2022 by text page via the [facility] messaging system. Electronically Signed   By:  Deatra Robinson M.D.   On: 07/28/2022 22:36    EKG: Independently reviewed.  Sinus rhythm, baseline wander.  No prior tracing for comparison.  Assessment and Plan  Acute CVA -Patient presenting with sudden onset slurred speech and left-sided weakness/paresthesias.  Symptoms occurred twice tonight and resolved. -CT head negative for acute finding.   -Neurology felt that she is not a tPA candidate due to rapid improvement of symptoms.   -Brain MRI showing a small focus of acute ischemia along the posterior limb of the right internal capsule.  No hemorrhage or mass effect. -Normal MRA of head and neck -Frequent neurochecks -Telemetry monitoring -Echocardiogram -A1c 5.7 -LDL 155, history of statin intolerance -Neurology recommending aspirin 81 mg daily along with Plavix 75 mg daily for 21 days, followed by aspirin 81 mg daily alone. -Permissive hypertension for the first 24 hours <220/110 -PT, OT, speech therapy. -N.p.o. until cleared by bedside swallow evaluation or formal speech evaluation   Prediabetes -A1c 5.7  Elevated blood pressure -No documented history of hypertension and not on medications -Initial blood pressure significantly elevated with systolic in the 200s with EMS, now improved with systolic in the 140s -Allow permissive hypertension  Hypothyroidism -Continue Synthroid  DVT prophylaxis: Lovenox Code Status: Full Code (discussed with the patient) Family Communication: No family available at this time. Consults called: Neurology Level of care: Telemetry bed Admission status: It is my clinical opinion that referral for OBSERVATION is reasonable and necessary in this patient based on the above information provided. The aforementioned taken together are felt to place the patient at high risk for further clinical deterioration. However, it is anticipated that the patient may be medically stable for discharge from the hospital within 24 to 48 hours.   John Giovanni  MD [facility]  If 7PM-7AM, please contact night-coverage [email or web address]  07/29/2022, 1:56 AM

## 2022-07-29 NOTE — Progress Notes (Signed)
STROKE TEAM PROGRESS NOTE   INTERVAL HISTORY Patient valuated at beside, appears resting comfortably. Denies any history of stroke/TIAs or hypertension. Endorses a persistent headache since the onset of her stroke that worsens with certain potions. Denies any hx of migraines, says she does into typically experience headaches. Reports unpleasant side effects from statins and Zetia in the past, acknowledges that her cholesterol is high and will speak to her OP provider for alternatives. No other acute complaints, is pleasant and cooperative throughout the evaluation.   Vitals:   07/29/22 0907 07/29/22 1100 07/29/22 1200 07/29/22 1300  BP: 136/61 (!) 148/61 135/68 137/81  Pulse: 60 65 65 65  Resp: 16 (!) 23 15 18   Temp: 97.9 F (36.6 C)     TempSrc: Oral     SpO2: 96% 95% 96% 95%  Weight:      Height:       CBC:  Recent Labs  Lab 07/28/22 2224 07/28/22 2236  WBC 6.7  --   NEUTROABS 3.1  --   HGB 14.1 14.6  HCT 42.8 43.0  MCV 89.7  --   PLT 311  --    Basic Metabolic Panel:  Recent Labs  Lab 07/28/22 2224 07/28/22 2236  NA 138 139  K 3.8 3.9  CL 103 103  CO2 25  --   GLUCOSE 110* 106*  BUN 15 15  CREATININE 0.89 0.80  CALCIUM 9.6  --    Lipid Panel:  Recent Labs  Lab 07/28/22 2224  CHOL 248*  TRIG 194*  HDL 54  CHOLHDL 4.6  VLDL 39  LDLCALC 07/30/22*   HgbA1c:  Recent Labs  Lab 07/28/22 2224  HGBA1C 5.7*   Urine Drug Screen:  Recent Labs  Lab 07/29/22 0830  LABOPIA NONE DETECTED  COCAINSCRNUR NONE DETECTED  LABBENZ NONE DETECTED  AMPHETMU NONE DETECTED  THCU NONE DETECTED  LABBARB NONE DETECTED    Alcohol Level  Recent Labs  Lab 07/28/22 2224  ETH <10    IMAGING past 24 hours MR BRAIN WO CONTRAST  Result Date: 07/29/2022 CLINICAL DATA:  Left upper and lower extremity tingling EXAM: MRI HEAD WITHOUT CONTRAST MRA HEAD WITHOUT CONTRAST MRA NECK WITHOUT CONTRAST TECHNIQUE: Multiplanar, multiecho pulse sequences of the brain and surrounding  structures were obtained without intravenous contrast. Angiographic images of the Circle of Willis were obtained using MRA technique without intravenous contrast. Angiographic images of the neck were obtained using MRA technique without intravenous contrast. Carotid stenosis measurements (when applicable) are obtained utilizing NASCET criteria, using the distal internal carotid diameter as the denominator. COMPARISON:  None Available. FINDINGS: MRI HEAD FINDINGS Brain: There is a small focus of acute ischemia along the right internal capsule posterior limb. No acute or chronic hemorrhage. Normal white matter signal, parenchymal volume and CSF spaces. The midline structures are normal. Vascular: Major flow voids are preserved. Skull and upper cervical spine: Normal calvarium and skull base. Visualized upper cervical spine and soft tissues are normal. Sinuses/Orbits:No paranasal sinus fluid levels or advanced mucosal thickening. No mastoid or middle ear effusion. Normal orbits. MRA HEAD FINDINGS POSTERIOR CIRCULATION: --Vertebral arteries: Normal --Inferior cerebellar arteries: Normal. --Basilar artery: Normal. --Superior cerebellar arteries: Normal. --Posterior cerebral arteries: Normal. ANTERIOR CIRCULATION: --Intracranial internal carotid arteries: Normal. --Anterior cerebral arteries (ACA): Normal. --Middle cerebral arteries (MCA): Normal. ANATOMIC VARIANTS: None MRA NECK FINDINGS No stenosis or occlusion of the carotid or vertebral arteries. IMPRESSION: 1. Small focus of acute ischemia along the posterior limb of the right internal capsule. No hemorrhage or  mass effect. 2. Normal MRA of the head and neck. Electronically Signed   By: Deatra Robinson M.D.   On: 07/29/2022 03:27   MR ANGIO HEAD WO CONTRAST  Result Date: 07/29/2022 CLINICAL DATA:  Left upper and lower extremity tingling EXAM: MRI HEAD WITHOUT CONTRAST MRA HEAD WITHOUT CONTRAST MRA NECK WITHOUT CONTRAST TECHNIQUE: Multiplanar, multiecho pulse  sequences of the brain and surrounding structures were obtained without intravenous contrast. Angiographic images of the Circle of Willis were obtained using MRA technique without intravenous contrast. Angiographic images of the neck were obtained using MRA technique without intravenous contrast. Carotid stenosis measurements (when applicable) are obtained utilizing NASCET criteria, using the distal internal carotid diameter as the denominator. COMPARISON:  None Available. FINDINGS: MRI HEAD FINDINGS Brain: There is a small focus of acute ischemia along the right internal capsule posterior limb. No acute or chronic hemorrhage. Normal white matter signal, parenchymal volume and CSF spaces. The midline structures are normal. Vascular: Major flow voids are preserved. Skull and upper cervical spine: Normal calvarium and skull base. Visualized upper cervical spine and soft tissues are normal. Sinuses/Orbits:No paranasal sinus fluid levels or advanced mucosal thickening. No mastoid or middle ear effusion. Normal orbits. MRA HEAD FINDINGS POSTERIOR CIRCULATION: --Vertebral arteries: Normal --Inferior cerebellar arteries: Normal. --Basilar artery: Normal. --Superior cerebellar arteries: Normal. --Posterior cerebral arteries: Normal. ANTERIOR CIRCULATION: --Intracranial internal carotid arteries: Normal. --Anterior cerebral arteries (ACA): Normal. --Middle cerebral arteries (MCA): Normal. ANATOMIC VARIANTS: None MRA NECK FINDINGS No stenosis or occlusion of the carotid or vertebral arteries. IMPRESSION: 1. Small focus of acute ischemia along the posterior limb of the right internal capsule. No hemorrhage or mass effect. 2. Normal MRA of the head and neck. Electronically Signed   By: Deatra Robinson M.D.   On: 07/29/2022 03:27   MR ANGIO NECK WO CONTRAST  Result Date: 07/29/2022 CLINICAL DATA:  Left upper and lower extremity tingling EXAM: MRI HEAD WITHOUT CONTRAST MRA HEAD WITHOUT CONTRAST MRA NECK WITHOUT CONTRAST  TECHNIQUE: Multiplanar, multiecho pulse sequences of the brain and surrounding structures were obtained without intravenous contrast. Angiographic images of the Circle of Willis were obtained using MRA technique without intravenous contrast. Angiographic images of the neck were obtained using MRA technique without intravenous contrast. Carotid stenosis measurements (when applicable) are obtained utilizing NASCET criteria, using the distal internal carotid diameter as the denominator. COMPARISON:  None Available. FINDINGS: MRI HEAD FINDINGS Brain: There is a small focus of acute ischemia along the right internal capsule posterior limb. No acute or chronic hemorrhage. Normal white matter signal, parenchymal volume and CSF spaces. The midline structures are normal. Vascular: Major flow voids are preserved. Skull and upper cervical spine: Normal calvarium and skull base. Visualized upper cervical spine and soft tissues are normal. Sinuses/Orbits:No paranasal sinus fluid levels or advanced mucosal thickening. No mastoid or middle ear effusion. Normal orbits. MRA HEAD FINDINGS POSTERIOR CIRCULATION: --Vertebral arteries: Normal --Inferior cerebellar arteries: Normal. --Basilar artery: Normal. --Superior cerebellar arteries: Normal. --Posterior cerebral arteries: Normal. ANTERIOR CIRCULATION: --Intracranial internal carotid arteries: Normal. --Anterior cerebral arteries (ACA): Normal. --Middle cerebral arteries (MCA): Normal. ANATOMIC VARIANTS: None MRA NECK FINDINGS No stenosis or occlusion of the carotid or vertebral arteries. IMPRESSION: 1. Small focus of acute ischemia along the posterior limb of the right internal capsule. No hemorrhage or mass effect. 2. Normal MRA of the head and neck. Electronically Signed   By: Deatra Robinson M.D.   On: 07/29/2022 03:27   CT HEAD CODE STROKE WO CONTRAST  Result Date: 07/28/2022 CLINICAL DATA:  Code stroke.  Left upper extremity paresthesia EXAM: CT HEAD WITHOUT CONTRAST  TECHNIQUE: Contiguous axial images were obtained from the base of the skull through the vertex without intravenous contrast. RADIATION DOSE REDUCTION: This exam was performed according to the departmental dose-optimization program which includes automated exposure control, adjustment of the mA and/or kV according to patient size and/or use of iterative reconstruction technique. COMPARISON:  None Available. FINDINGS: Brain: There is no mass, hemorrhage or extra-axial collection. The size and configuration of the ventricles and extra-axial CSF spaces are normal. The brain parenchyma is normal, without evidence of acute or chronic infarction. Vascular: No abnormal hyperdensity of the major intracranial arteries or dural venous sinuses. No intracranial atherosclerosis. Skull: The visualized skull base, calvarium and extracranial soft tissues are normal. Sinuses/Orbits: No fluid levels or advanced mucosal thickening of the visualized paranasal sinuses. No mastoid or middle ear effusion. The orbits are normal. ASPECTS [facility] Stroke Program Early CT Score) - Ganglionic level infarction (caudate, lentiform nuclei, internal capsule, insula, M1-M3 cortex): 7 - Supraganglionic infarction (M4-M6 cortex): 3 Total score (0-10 with 10 being normal): 10 IMPRESSION: 1. No acute intracranial abnormality. 2. ASPECTS is 10. These results were communicated to Dr. Erick Blinks at 10:35 pm on 07/28/2022 by text page via the [facility] messaging system. Electronically Signed   By: Deatra Robinson M.D.   On: 07/28/2022 22:36     --PHYSICAL EXAM-- General: Laying comfortably in bed; in no acute distress.  HENT: Normal oropharynx and mucosa. Normal external appearance of ears and nose.  Neck: Supple, no pain or tenderness  CV: No JVD. No peripheral edema.  Pulmonary: Symmetric Chest rise. Normal respiratory effort.  Abdomen: Soft to touch, non-tender.  Ext: No cyanosis, edema, or deformity  Skin: No rash. Normal palpation of skin.    Musculoskeletal: Normal digits and nails by inspection. No clubbing.   Neuro Mental Status AAOx3  Language: Intact Fluency, Comprehension, Naming  Cranial Nerves II: Nor abnormalities in visual fields. III, IV, VI: EOM intact, no gaze preference or deviation, no nystagmus  V: Normal sensation in V1, V2, and V3 segments bilaterally  VII: Facial symmetry at rest and during various facial expressions VIII: Normal hearing to speech  IX, X: Normal palatal elevation, no uvular deviation  XI: 5/5 head turn and 5/5 shoulder shrug bilaterally XII: Symmetric tongue movement, tongue is midline without atrophy or fasciculations.   Motor Strength R UE: 5/5 L UE: 5/5 R LE: 5/5 L LE: 5/5  Sensory  Intact sensation of UE and LE bilaterally.   Coordination/Complex Motor:  Finger to Nose intact bilaterally Heel to shin intact bilaterally Rapid alternating movement are normal Gait: Deferred.   ASSESSMENT/PLAN Valerie Crawford is a 71 y.o. female with history of  PMH significant for hypothyroidism who presents with sudden onset slurred speech, tingling in LUE and then spread to LLE extremity with left sided weakness.Now with complete resolution of these symptoms on evaluation.   Stroke Acute ischemia along the posterior limb of the right internal capsule Etiology: Likely small vessel disease in the setting of untreated HLD Code Stroke: CT head. No acute abnormality. Atrophy. ASPECTS 10.    MRI: Small focus of acute ischemia along the posterior limb of the right internal capsule. No hemorrhage or mass effect. MRA:  No acute abnormality 2D Echo: pending LDL 155 HgbA1c 5.7 VTE prophylaxis - Lovenox No antithrombotic prior to admission, now on aspirin 81 mg daily and clopidogrel 75 mg daily.  Therapy recommendations:  pending Disposition:  pending  Hypertension Home meds:  none Stable SBP goal - permissive hypertension first 24 h < 220/110. Long-term BP goal  normotensive  Hyperlipidemia Home meds:  none, Declined statins and zetia LDL 155, goal < 70 Continue statin at discharge   Other Stroke Risk Factors Advanced Age >/= 35    Other Active Problems Headaches - Ibuprofen 400 mg PRN Hypothyroidism - Synthroid 75 mcg   Hospital day # 0  Lorri Frederick, MD PGY-1   To contact Stroke Continuity provider, please refer to [email or web address]. After hours, contact General Neurology

## 2022-07-30 DIAGNOSIS — R030 Elevated blood-pressure reading, without diagnosis of hypertension: Secondary | ICD-10-CM

## 2022-07-30 DIAGNOSIS — I639 Cerebral infarction, unspecified: Secondary | ICD-10-CM | POA: Diagnosis not present

## 2022-07-30 DIAGNOSIS — E782 Mixed hyperlipidemia: Secondary | ICD-10-CM | POA: Diagnosis not present

## 2022-07-30 DIAGNOSIS — E039 Hypothyroidism, unspecified: Secondary | ICD-10-CM

## 2022-07-30 MED ORDER — CLOPIDOGREL BISULFATE 75 MG PO TABS: 75.0000 mg | ORAL_TABLET | Freq: Every day | ORAL | 0 refills | Status: AC

## 2022-07-30 MED ORDER — ASPIRIN 81 MG PO CHEW: 81.0000 mg | CHEWABLE_TABLET | Freq: Every day | ORAL | 3 refills | Status: DC

## 2022-07-30 NOTE — Plan of Care (Signed)
Problem: Education: Goal: Knowledge of General Education information will improve Description: Including pain rating scale, medication(s)/side effects and non-pharmacologic comfort measures 07/30/2022 1438 by Rosey Bath, RN Outcome: Adequate for Discharge 07/30/2022 1356 by Rosey Bath, RN Outcome: Progressing   Problem: Health Behavior/Discharge Planning: Goal: Ability to manage health-related needs will improve 07/30/2022 1438 by Rosey Bath, RN Outcome: Adequate for Discharge 07/30/2022 1356 by Rosey Bath, RN Outcome: Progressing   Problem: Clinical Measurements: Goal: Ability to maintain clinical measurements within normal limits will improve 07/30/2022 1438 by Rosey Bath, RN Outcome: Adequate for Discharge 07/30/2022 1356 by Rosey Bath, RN Outcome: Progressing Goal: Will remain free from infection 07/30/2022 1438 by Rosey Bath, RN Outcome: Adequate for Discharge 07/30/2022 1356 by Rosey Bath, RN Outcome: Progressing Goal: Diagnostic test results will improve 07/30/2022 1438 by Rosey Bath, RN Outcome: Adequate for Discharge 07/30/2022 1356 by Rosey Bath, RN Outcome: Progressing Goal: Respiratory complications will improve 07/30/2022 1438 by Rosey Bath, RN Outcome: Adequate for Discharge 07/30/2022 1356 by Rosey Bath, RN Outcome: Progressing Goal: Cardiovascular complication will be avoided 07/30/2022 1438 by Rosey Bath, RN Outcome: Adequate for Discharge 07/30/2022 1356 by Rosey Bath, RN Outcome: Progressing   Problem: Activity: Goal: Risk for activity intolerance will decrease 07/30/2022 1438 by Rosey Bath, RN Outcome: Adequate for Discharge 07/30/2022 1356 by Rosey Bath, RN Outcome: Progressing   Problem: Nutrition: Goal: Adequate nutrition will be maintained 07/30/2022 1438 by Rosey Bath, RN Outcome: Adequate for Discharge 07/30/2022 1356 by Rosey Bath, RN Outcome:  Progressing   Problem: Coping: Goal: Level of anxiety will decrease 07/30/2022 1438 by Rosey Bath, RN Outcome: Adequate for Discharge 07/30/2022 1356 by Rosey Bath, RN Outcome: Progressing   Problem: Elimination: Goal: Will not experience complications related to bowel motility 07/30/2022 1438 by Rosey Bath, RN Outcome: Adequate for Discharge 07/30/2022 1356 by Rosey Bath, RN Outcome: Progressing Goal: Will not experience complications related to urinary retention 07/30/2022 1438 by Rosey Bath, RN Outcome: Adequate for Discharge 07/30/2022 1356 by Rosey Bath, RN Outcome: Progressing   Problem: Pain Managment: Goal: General experience of comfort will improve 07/30/2022 1438 by Rosey Bath, RN Outcome: Adequate for Discharge 07/30/2022 1356 by Rosey Bath, RN Outcome: Progressing   Problem: Safety: Goal: Ability to remain free from injury will improve 07/30/2022 1438 by Rosey Bath, RN Outcome: Adequate for Discharge 07/30/2022 1356 by Rosey Bath, RN Outcome: Progressing   Problem: Skin Integrity: Goal: Risk for impaired skin integrity will decrease 07/30/2022 1438 by Rosey Bath, RN Outcome: Adequate for Discharge 07/30/2022 1356 by Rosey Bath, RN Outcome: Progressing   Problem: Education: Goal: Knowledge of disease or condition will improve 07/30/2022 1438 by Rosey Bath, RN Outcome: Adequate for Discharge 07/30/2022 1356 by Rosey Bath, RN Outcome: Progressing Goal: Knowledge of secondary prevention will improve (SELECT ALL) 07/30/2022 1438 by Rosey Bath, RN Outcome: Adequate for Discharge 07/30/2022 1356 by Rosey Bath, RN Outcome: Progressing Goal: Knowledge of patient specific risk factors will improve (INDIVIDUALIZE FOR PATIENT) 07/30/2022 1438 by Rosey Bath, RN Outcome: Adequate for Discharge 07/30/2022 1356 by Rosey Bath, RN Outcome: Progressing Goal: Individualized  Educational Video(s) 07/30/2022 1438 by Rosey Bath, RN Outcome: Adequate for Discharge 07/30/2022 1356 by Rosey Bath, RN Outcome: Progressing   Problem: Coping: Goal: Will verbalize positive feelings about self 07/30/2022 1438 by Rosey Bath, RN Outcome: Adequate for Discharge 07/30/2022 1356 by Rosey Bath, RN Outcome: Progressing Goal: Will identify appropriate support needs 07/30/2022 1438 by Rosey Bath, RN Outcome: Adequate for Discharge 07/30/2022 1356 by Rosey Bath, RN Outcome: Progressing  Problem: Health Behavior/Discharge Planning: Goal: Ability to manage health-related needs will improve 07/30/2022 1438 by Ellison Carwin, RN Outcome: Adequate for Discharge 07/30/2022 1356 by Ellison Carwin, RN Outcome: Progressing   Problem: Self-Care: Goal: Ability to participate in self-care as condition permits will improve 07/30/2022 1438 by Ellison Carwin, RN Outcome: Adequate for Discharge 07/30/2022 1356 by Ellison Carwin, RN Outcome: Progressing Goal: Verbalization of feelings and concerns over difficulty with self-care will improve 07/30/2022 1438 by Ellison Carwin, RN Outcome: Adequate for Discharge 07/30/2022 1356 by Ellison Carwin, RN Outcome: Progressing Goal: Ability to communicate needs accurately will improve 07/30/2022 1438 by Ellison Carwin, RN Outcome: Adequate for Discharge 07/30/2022 1356 by Ellison Carwin, RN Outcome: Progressing   Problem: Nutrition: Goal: Risk of aspiration will decrease 07/30/2022 1438 by Ellison Carwin, RN Outcome: Adequate for Discharge 07/30/2022 1356 by Ellison Carwin, RN Outcome: Progressing Goal: Dietary intake will improve 07/30/2022 1438 by Ellison Carwin, RN Outcome: Adequate for Discharge 07/30/2022 1356 by Ellison Carwin, RN Outcome: Progressing   Problem: Intracerebral Hemorrhage Tissue Perfusion: Goal: Complications of Intracerebral Hemorrhage will be minimized 07/30/2022  1438 by Ellison Carwin, RN Outcome: Adequate for Discharge 07/30/2022 1356 by Ellison Carwin, RN Outcome: Progressing   Problem: Ischemic Stroke/TIA Tissue Perfusion: Goal: Complications of ischemic stroke/TIA will be minimized 07/30/2022 1438 by Ellison Carwin, RN Outcome: Adequate for Discharge 07/30/2022 1356 by Ellison Carwin, RN Outcome: Progressing   Problem: Spontaneous Subarachnoid Hemorrhage Tissue Perfusion: Goal: Complications of Spontaneous Subarachnoid Hemorrhage will be minimized 07/30/2022 1438 by Ellison Carwin, RN Outcome: Adequate for Discharge 07/30/2022 1356 by Ellison Carwin, RN Outcome: Progressing

## 2022-07-30 NOTE — Discharge Summary (Signed)
Physician Discharge Summary   Patient: Valerie Crawford MRN: [id number] DOB: 1950-12-20  Admit date:     07/28/2022  Discharge date: 07/30/22  Discharge Physician: Meredeth Ide   PCP: Clayborn Heron, MD   Recommendations at discharge:   Follow-up PCP in 2 weeks Check blood pressure at home every day Follow-up [facility] in 1 month Take aspirin and Plavix for 3 weeks and then stop Plavix and continue with aspirin Follow-up cardiology as outpatient to discuss options for high lipid treatment  Discharge Diagnoses: Principal Problem:   Acute CVA (cerebrovascular accident) (HCC) Active Problems:   Prediabetes   Elevated blood pressure reading   Hypothyroidism  Resolved Problems:   * No resolved hospital problems. [facility] Meeting Course:  Patient admitted this morning, see detailed H&P by Dr Loney Loh 71 year old female with medical history of hypothyroidism, allergic rhinitis came to ED for evaluation of sudden onset slurred speech and left-sided weakness/paresthesia.  Patient was hypertensive on presentation with blood pressure 228/140.  Symptoms had resolved in route.  She was not found to be a tPA candidate as the symptoms had improved.  Hemoglobin A1c 5.7.   Patient states tonight around 8:15 PM she had just finished a Zoom meeting and was taking some notes when she noticed she was having difficulty writing.  She is left-handed.  She experienced tingling in her left arm.  When she tried to get up, she felt lightheaded.  Symptoms lasted about 5 minutes and resolved.  She then called her daughter and as she was waiting for EMS to arrive, she had recurrence of symptoms and this time her tongue felt heavy and speech was slurred.  Her left leg was tingling and she had difficulty walking.  Symptoms resolved in about 10 minutes.  She denies history of strokes in the past.  Reports history of hyperlipidemia but has not been able to tolerate to 3 or 4 different statins in the  past due to severe myalgias/flulike symptoms.  She does not smoke cigarettes.  Denies history of hypertension.  No other complaints.   Neurology was consulted. Patient's symptoms have resolved. MRI brain shows small focus of acute ischemia along the posterior limb of the right internal capsule.       Vitals:    Assessment and Plan:  Stroke  -Patient's symptoms have completely resolved however MRI brain shows area of small focus of acute ischemia along the posterior limb of the right internal capsule -Blood pressure was elevated at presentation; 228/140 -Stroke work-up was initiated -Continue aspirin and Plavix for 3 weeks and then stop Plavix and continue with aspirin -LDL 155; patient was intolerant of statins in the past.  Started on Zetia -MRA of head and neck is normal -Echocardiogram showed normal EF, no wall motion normality, no intracardiac source of emboli, very small intra-atrial shunt on bubble study; she does not have lower extremity swelling, discussed with neurology, patient does not need venous duplex of lower extremities.  Patient's stroke is likely from hypotension and not embolic phenomenon. -Patient is intolerant of Zetia and has allergy to statin.  Will discuss with cardiology as outpatient for further options for high lipid treatment.   Hypertension -Blood pressure was elevated at presentation -Blood pressure has significantly improved -Advised patient to check blood pressure every day and follow-up with PCP in 2 weeks   Hypothyroidism -Continue Synthroid       Consultants: Neurology Procedures performed: Echocardiogram Disposition: Home Diet recommendation:  Discharge Diet Orders (From admission, onward)  Start     Ordered   07/30/22 0000  Diet - low sodium heart healthy        07/30/22 1355           Cardiac diet DISCHARGE MEDICATION: Allergies as of 07/30/2022       Reactions   Other    Contrast dye - rash   Statins    Flu like  syndrome        Medication List     TAKE these medications    aspirin 81 MG chewable tablet Chew 1 tablet (81 mg total) by mouth daily. Start taking on: July 31, 2022   clopidogrel 75 MG tablet Commonly known as: PLAVIX Take 1 tablet (75 mg total) by mouth daily for 19 days. Take aspirin along with Plavix for 19 more days, then stop Plavix and continue with aspirin indefinitely Start taking on: July 31, 2022   EPINEPHrine 0.3 mg/0.3 mL Soaj injection Commonly known as: EPI-PEN Inject 0.3 mLs (0.3 mg total) into the muscle as needed for anaphylaxis.   fluticasone 50 MCG/ACT nasal spray Commonly known as: FLONASE Place 1-2 sprays into the nose at bedtime.   levothyroxine 75 MCG tablet Commonly known as: SYNTHROID Take 75 mcg by mouth daily before breakfast.        Follow-up Information     [facility]. Schedule an appointment as soon as possible for a visit in 1 month(s).   Specialty: Neurology Why: stroke clinic Contact information: [address] [place] [phone]               Discharge Exam: Ceasar MonsFiled Weights   07/28/22 2239  Weight: 80 kg   General-appears in no acute distress Heart-S1-S2, regular, no murmur auscultated Lungs-clear to auscultation bilaterally, no wheezing or crackles auscultated Abdomen-soft, nontender, no organomegaly Extremities-no edema in the lower extremities Neuro-alert, oriented x3, no focal deficit noted  Condition at discharge: good  The results of significant diagnostics from this hospitalization (including imaging, microbiology, ancillary and laboratory) are listed below for reference.   Imaging Studies: ECHOCARDIOGRAM COMPLETE BUBBLE STUDY  Result Date: 07/29/2022    ECHOCARDIOGRAM REPORT   Patient Name:   Valerie Crawford Date of Exam: 07/29/2022 Medical Rec #:  [id number]       Height:       65.0 in Accession #:    [id number]      Weight:       176.4 lb  Date of Birth:  08/16/1951       BSA:          1.875 m Patient Age:    71 years        BP:           141/69 mmHg Patient Gender: F               HR:           65 bpm. Exam Location:  Inpatient Procedure: 2D Echo, Color Doppler, Cardiac Doppler and Saline Contrast Bubble            Study Indications:    Stroke  History:        Patient has no prior history of Echocardiogram examinations.  Sonographer:    Gaynell FaceEllisa Machuca Referring Phys: [id number] VASUNDHRA RATHORE IMPRESSIONS  1. Left ventricular ejection fraction, by estimation, is 60 to 65%. The left ventricle has normal function. The left ventricle has no regional wall motion abnormalities. Left ventricular diastolic parameters were normal.  2. Right ventricular systolic function is normal. The right ventricular size is normal. Tricuspid regurgitation signal is inadequate for assessing PA pressure.  3. The mitral valve is normal in structure. Trivial mitral valve regurgitation. No evidence of mitral stenosis.  4. The aortic valve was not well visualized. There is mild calcification of the aortic valve. Aortic valve regurgitation is not visualized. Aortic valve sclerosis is present, with no evidence of aortic valve stenosis.  5. The inferior vena cava is normal in size with greater than 50% respiratory variability, suggesting right atrial pressure of 3 mmHg.  6. Agitated saline contrast bubble study was positive with shunting observed within 3-6 cardiac cycles suggestive of interatrial shunt. Comparison(s): No prior Echocardiogram. Conclusion(s)/Recommendation(s): Normal biventricular function without evidence of hemodynamically significant valvular heart disease. No intracardiac source of embolism detected on this transthoracic study. Consider a transesophageal echocardiogram to exclude cardiac source of embolism if clinically indicated. FINDINGS  Left Ventricle: Left ventricular ejection fraction, by estimation, is 60 to 65%. The left ventricle has normal function.  The left ventricle has no regional wall motion abnormalities. The left ventricular internal cavity size was normal in size. There is  no left ventricular hypertrophy. Left ventricular diastolic parameters were normal. Right Ventricle: The right ventricular size is normal. No increase in right ventricular wall thickness. Right ventricular systolic function is normal. Tricuspid regurgitation signal is inadequate for assessing PA pressure. Left Atrium: Left atrial size was normal in size. Right Atrium: Right atrial size was normal in size. Pericardium: There is no evidence of pericardial effusion. Mitral Valve: The mitral valve is normal in structure. Trivial mitral valve regurgitation. No evidence of mitral valve stenosis. Tricuspid Valve: The tricuspid valve is grossly normal. Tricuspid valve regurgitation is trivial. No evidence of tricuspid stenosis. Aortic Valve: The aortic valve was not well visualized. There is mild calcification of the aortic valve. Aortic valve regurgitation is not visualized. Aortic valve sclerosis is present, with no evidence of aortic valve stenosis. Aortic valve mean gradient measures 4.0 mmHg. Aortic valve peak gradient measures 7.8 mmHg. Aortic valve area, by VTI measures 2.14 cm. Pulmonic Valve: The pulmonic valve was not well visualized. Pulmonic valve regurgitation is not visualized. Aorta: The aortic root, ascending aorta and aortic arch are all structurally normal, with no evidence of dilitation or obstruction. Venous: The inferior vena cava is normal in size with greater than 50% respiratory variability, suggesting right atrial pressure of 3 mmHg. IAS/Shunts: No atrial level shunt detected by color flow Doppler. Agitated saline contrast was given intravenously to evaluate for intracardiac shunting. Agitated saline contrast bubble study was positive with shunting observed within 3-6 cardiac cycles suggestive of interatrial shunt.  LEFT VENTRICLE PLAX 2D LVIDd:         3.50 cm    Diastology LVIDs:         2.40 cm   LV e' medial:    6.74 cm/s LV PW:         1.00 cm   LV E/e' medial:  10.3 LV IVS:        1.00 cm   LV e' lateral:   6.96 cm/s LVOT diam:     2.00 cm   LV E/e' lateral: 10.0 LV SV:         60 LV SV Index:   32 LVOT Area:     3.14 cm  RIGHT VENTRICLE TAPSE (M-mode): 2.0 cm LEFT ATRIUM             Index  RIGHT ATRIUM           Index LA diam:        2.90 cm 1.55 cm/m   RA Area:     10.30 cm LA Vol (A2C):   35.8 ml 19.09 ml/m  RA Volume:   21.50 ml  11.47 ml/m LA Vol (A4C):   29.2 ml 15.57 ml/m LA Biplane Vol: 34.0 ml 18.13 ml/m  AORTIC VALVE AV Area (Vmax):    2.14 cm AV Area (Vmean):   2.14 cm AV Area (VTI):     2.14 cm AV Vmax:           140.00 cm/s AV Vmean:          89.600 cm/s AV VTI:            0.282 m AV Peak Grad:      7.8 mmHg AV Mean Grad:      4.0 mmHg LVOT Vmax:         95.40 cm/s LVOT Vmean:        60.900 cm/s LVOT VTI:          0.192 m LVOT/AV VTI ratio: 0.68  AORTA Ao Asc diam: 3.30 cm MITRAL VALVE MV Area (PHT): 3.03 cm    SHUNTS MV Decel Time: 250 msec    Systemic VTI:  0.19 m MV E velocity: 69.70 cm/s  Systemic Diam: 2.00 cm MV A velocity: 91.30 cm/s MV E/A ratio:  0.76 Jodelle Red MD Electronically signed by Jodelle Red MD Signature Date/Time: 07/29/2022/4:03:23 PM    Final    MR BRAIN WO CONTRAST  Result Date: 07/29/2022 CLINICAL DATA:  Left upper and lower extremity tingling EXAM: MRI HEAD WITHOUT CONTRAST MRA HEAD WITHOUT CONTRAST MRA NECK WITHOUT CONTRAST TECHNIQUE: Multiplanar, multiecho pulse sequences of the brain and surrounding structures were obtained without intravenous contrast. Angiographic images of the Circle of Willis were obtained using MRA technique without intravenous contrast. Angiographic images of the neck were obtained using MRA technique without intravenous contrast. Carotid stenosis measurements (when applicable) are obtained utilizing NASCET criteria, using the distal internal carotid diameter as the  denominator. COMPARISON:  None Available. FINDINGS: MRI HEAD FINDINGS Brain: There is a small focus of acute ischemia along the right internal capsule posterior limb. No acute or chronic hemorrhage. Normal white matter signal, parenchymal volume and CSF spaces. The midline structures are normal. Vascular: Major flow voids are preserved. Skull and upper cervical spine: Normal calvarium and skull base. Visualized upper cervical spine and soft tissues are normal. Sinuses/Orbits:No paranasal sinus fluid levels or advanced mucosal thickening. No mastoid or middle ear effusion. Normal orbits. MRA HEAD FINDINGS POSTERIOR CIRCULATION: --Vertebral arteries: Normal --Inferior cerebellar arteries: Normal. --Basilar artery: Normal. --Superior cerebellar arteries: Normal. --Posterior cerebral arteries: Normal. ANTERIOR CIRCULATION: --Intracranial internal carotid arteries: Normal. --Anterior cerebral arteries (ACA): Normal. --Middle cerebral arteries (MCA): Normal. ANATOMIC VARIANTS: None MRA NECK FINDINGS No stenosis or occlusion of the carotid or vertebral arteries. IMPRESSION: 1. Small focus of acute ischemia along the posterior limb of the right internal capsule. No hemorrhage or mass effect. 2. Normal MRA of the head and neck. Electronically Signed   By: Deatra Robinson M.D.   On: 07/29/2022 03:27   MR ANGIO HEAD WO CONTRAST  Result Date: 07/29/2022 CLINICAL DATA:  Left upper and lower extremity tingling EXAM: MRI HEAD WITHOUT CONTRAST MRA HEAD WITHOUT CONTRAST MRA NECK WITHOUT CONTRAST TECHNIQUE: Multiplanar, multiecho pulse sequences of the brain and surrounding structures were obtained without intravenous contrast. Angiographic images  of the Circle of Willis were obtained using MRA technique without intravenous contrast. Angiographic images of the neck were obtained using MRA technique without intravenous contrast. Carotid stenosis measurements (when applicable) are obtained utilizing NASCET criteria, using the distal  internal carotid diameter as the denominator. COMPARISON:  None Available. FINDINGS: MRI HEAD FINDINGS Brain: There is a small focus of acute ischemia along the right internal capsule posterior limb. No acute or chronic hemorrhage. Normal white matter signal, parenchymal volume and CSF spaces. The midline structures are normal. Vascular: Major flow voids are preserved. Skull and upper cervical spine: Normal calvarium and skull base. Visualized upper cervical spine and soft tissues are normal. Sinuses/Orbits:No paranasal sinus fluid levels or advanced mucosal thickening. No mastoid or middle ear effusion. Normal orbits. MRA HEAD FINDINGS POSTERIOR CIRCULATION: --Vertebral arteries: Normal --Inferior cerebellar arteries: Normal. --Basilar artery: Normal. --Superior cerebellar arteries: Normal. --Posterior cerebral arteries: Normal. ANTERIOR CIRCULATION: --Intracranial internal carotid arteries: Normal. --Anterior cerebral arteries (ACA): Normal. --Middle cerebral arteries (MCA): Normal. ANATOMIC VARIANTS: None MRA NECK FINDINGS No stenosis or occlusion of the carotid or vertebral arteries. IMPRESSION: 1. Small focus of acute ischemia along the posterior limb of the right internal capsule. No hemorrhage or mass effect. 2. Normal MRA of the head and neck. Electronically Signed   By: Deatra Robinson M.D.   On: 07/29/2022 03:27   MR ANGIO NECK WO CONTRAST  Result Date: 07/29/2022 CLINICAL DATA:  Left upper and lower extremity tingling EXAM: MRI HEAD WITHOUT CONTRAST MRA HEAD WITHOUT CONTRAST MRA NECK WITHOUT CONTRAST TECHNIQUE: Multiplanar, multiecho pulse sequences of the brain and surrounding structures were obtained without intravenous contrast. Angiographic images of the Circle of Willis were obtained using MRA technique without intravenous contrast. Angiographic images of the neck were obtained using MRA technique without intravenous contrast. Carotid stenosis measurements (when applicable) are obtained utilizing  NASCET criteria, using the distal internal carotid diameter as the denominator. COMPARISON:  None Available. FINDINGS: MRI HEAD FINDINGS Brain: There is a small focus of acute ischemia along the right internal capsule posterior limb. No acute or chronic hemorrhage. Normal white matter signal, parenchymal volume and CSF spaces. The midline structures are normal. Vascular: Major flow voids are preserved. Skull and upper cervical spine: Normal calvarium and skull base. Visualized upper cervical spine and soft tissues are normal. Sinuses/Orbits:No paranasal sinus fluid levels or advanced mucosal thickening. No mastoid or middle ear effusion. Normal orbits. MRA HEAD FINDINGS POSTERIOR CIRCULATION: --Vertebral arteries: Normal --Inferior cerebellar arteries: Normal. --Basilar artery: Normal. --Superior cerebellar arteries: Normal. --Posterior cerebral arteries: Normal. ANTERIOR CIRCULATION: --Intracranial internal carotid arteries: Normal. --Anterior cerebral arteries (ACA): Normal. --Middle cerebral arteries (MCA): Normal. ANATOMIC VARIANTS: None MRA NECK FINDINGS No stenosis or occlusion of the carotid or vertebral arteries. IMPRESSION: 1. Small focus of acute ischemia along the posterior limb of the right internal capsule. No hemorrhage or mass effect. 2. Normal MRA of the head and neck. Electronically Signed   By: Deatra Robinson M.D.   On: 07/29/2022 03:27   CT HEAD CODE STROKE WO CONTRAST  Result Date: 07/28/2022 CLINICAL DATA:  Code stroke.  Left upper extremity paresthesia EXAM: CT HEAD WITHOUT CONTRAST TECHNIQUE: Contiguous axial images were obtained from the base of the skull through the vertex without intravenous contrast. RADIATION DOSE REDUCTION: This exam was performed according to the departmental dose-optimization program which includes automated exposure control, adjustment of the mA and/or kV according to patient size and/or use of iterative reconstruction technique. COMPARISON:  None Available.  FINDINGS: Brain: There is no  mass, hemorrhage or extra-axial collection. The size and configuration of the ventricles and extra-axial CSF spaces are normal. The brain parenchyma is normal, without evidence of acute or chronic infarction. Vascular: No abnormal hyperdensity of the major intracranial arteries or dural venous sinuses. No intracranial atherosclerosis. Skull: The visualized skull base, calvarium and extracranial soft tissues are normal. Sinuses/Orbits: No fluid levels or advanced mucosal thickening of the visualized paranasal sinuses. No mastoid or middle ear effusion. The orbits are normal. ASPECTS [facility] Stroke Program Early CT Score) - Ganglionic level infarction (caudate, lentiform nuclei, internal capsule, insula, M1-M3 cortex): 7 - Supraganglionic infarction (M4-M6 cortex): 3 Total score (0-10 with 10 being normal): 10 IMPRESSION: 1. No acute intracranial abnormality. 2. ASPECTS is 10. These results were communicated to Dr. Erick Blinks at 10:35 pm on 07/28/2022 by text page via the [facility] messaging system. Electronically Signed   By: Deatra Robinson M.D.   On: 07/28/2022 22:36    Microbiology: Results for orders placed or performed during the hospital encounter of 07/28/22  Resp Panel by RT-PCR (Flu A&B, Covid) Anterior Nasal Swab     Status: None   Collection Time: 07/28/22 10:20 PM   Specimen: Anterior Nasal Swab  Result Value Ref Range Status   SARS Coronavirus 2 by RT PCR NEGATIVE NEGATIVE Final    Comment: (NOTE) SARS-CoV-2 target nucleic acids are NOT DETECTED.  The SARS-CoV-2 RNA is generally detectable in upper respiratory specimens during the acute phase of infection. The lowest concentration of SARS-CoV-2 viral copies this assay can detect is 138 copies/mL. A negative result does not preclude SARS-Cov-2 infection and should not be used as the sole basis for treatment or other patient management decisions. A negative result may occur with  improper specimen  collection/handling, submission of specimen other than nasopharyngeal swab, presence of viral mutation(s) within the areas targeted by this assay, and inadequate number of viral copies(<138 copies/mL). A negative result must be combined with clinical observations, patient history, and epidemiological information. The expected result is Negative.  Fact Sheet for Patients:  BloggerCourse.com  Fact Sheet for Healthcare Providers:  SeriousBroker.it  This test is no t yet approved or cleared by the [place] FDA and  has been authorized for detection and/or diagnosis of SARS-CoV-2 by FDA under an Emergency Use Authorization (EUA). This EUA will remain  in effect (meaning this test can be used) for the duration of the COVID-19 declaration under Section 564(b)(1) of the Act, 21 U.S.C.section 360bbb-3(b)(1), unless the authorization is terminated  or revoked sooner.       Influenza A by PCR NEGATIVE NEGATIVE Final   Influenza B by PCR NEGATIVE NEGATIVE Final    Comment: (NOTE) The Xpert Xpress SARS-CoV-2/FLU/RSV plus assay is intended as an aid in the diagnosis of influenza from Nasopharyngeal swab specimens and should not be used as a sole basis for treatment. Nasal washings and aspirates are unacceptable for Xpert Xpress SARS-CoV-2/FLU/RSV testing.  Fact Sheet for Patients: BloggerCourse.com  Fact Sheet for Healthcare Providers: SeriousBroker.it  This test is not yet approved or cleared by the [place] FDA and has been authorized for detection and/or diagnosis of SARS-CoV-2 by FDA under an Emergency Use Authorization (EUA). This EUA will remain in effect (meaning this test can be used) for the duration of the COVID-19 declaration under Section 564(b)(1) of the Act, 21 U.S.C. section 360bbb-3(b)(1), unless the authorization is terminated or revoked.  Performed at [facility], [address], [place], [place] [address]  Labs: CBC: Recent Labs  Lab 07/28/22 2224 07/28/22 2236  WBC 6.7  --   NEUTROABS 3.1  --   HGB 14.1 14.6  HCT 42.8 43.0  MCV 89.7  --   PLT 311  --    Basic Metabolic Panel: Recent Labs  Lab 07/28/22 2224 07/28/22 2236  NA 138 139  K 3.8 3.9  CL 103 103  CO2 25  --   GLUCOSE 110* 106*  BUN 15 15  CREATININE 0.89 0.80  CALCIUM 9.6  --    Liver Function Tests: Recent Labs  Lab 07/28/22 2224  AST 29  ALT 30  ALKPHOS 54  BILITOT 0.2*  PROT 6.7  ALBUMIN 4.1   CBG: Recent Labs  Lab 07/28/22 2215  GLUCAP 102*    Discharge time spent: greater than 30 minutes.  Signed: Meredeth Ide, MD [facility] 07/30/2022

## 2022-07-30 NOTE — Progress Notes (Signed)
STROKE TEAM PROGRESS NOTE   INTERVAL HISTORY No family at bedside.  Patient sitting in bed for lunch.  Stated that her symptoms all resolved.  She had statin and Zetia intolerance, she follow-up with Dr.Hilty As outpatient in cardiology.  Recommend her continue to follow-up with Dr. Debara Pickett in lipid clinic  Vitals:   07/29/22 2340 07/30/22 0325 07/30/22 0933 07/30/22 1212  BP: 125/61 134/78 (!) 140/68 91/69  Pulse: 65 69 62 70  Resp: 18 18 18 18   Temp: 97.8 F (36.6 C) 97.8 F (36.6 C) 97.8 F (36.6 C) 98.4 F (36.9 C)  TempSrc: Oral Oral Oral Oral  SpO2: 90% 96%  95%  Weight:      Height:       CBC:  Recent Labs  Lab 07/28/22 2224 07/28/22 2236  WBC 6.7  --   NEUTROABS 3.1  --   HGB 14.1 14.6  HCT 42.8 43.0  MCV 89.7  --   PLT 311  --    Basic Metabolic Panel:  Recent Labs  Lab 07/28/22 2224 07/28/22 2236  NA 138 139  K 3.8 3.9  CL 103 103  CO2 25  --   GLUCOSE 110* 106*  BUN 15 15  CREATININE 0.89 0.80  CALCIUM 9.6  --    Lipid Panel:  Recent Labs  Lab 07/28/22 2224  CHOL 248*  TRIG 194*  HDL 54  CHOLHDL 4.6  VLDL 39  LDLCALC 155*   HgbA1c:  Recent Labs  Lab 07/28/22 2224  HGBA1C 5.7*   Urine Drug Screen:  Recent Labs  Lab 07/29/22 0830  LABOPIA NONE DETECTED  COCAINSCRNUR NONE DETECTED  LABBENZ NONE DETECTED  AMPHETMU NONE DETECTED  THCU NONE DETECTED  LABBARB NONE DETECTED    Alcohol Level  Recent Labs  Lab 07/28/22 2224  ETH <10    IMAGING past 24 hours No results found.   --PHYSICAL EXAM-- General: Laying comfortably in bed; in no acute distress.  HENT: Normal oropharynx and mucosa. Normal external appearance of ears and nose.  Neck: Supple, no pain or tenderness  CV: No JVD. No peripheral edema.  Pulmonary: Symmetric Chest rise. Normal respiratory effort.  Abdomen: Soft to touch, non-tender.  Ext: No cyanosis, edema, or deformity  Skin: No rash. Normal palpation of skin.   Musculoskeletal: Normal digits and nails by  inspection. No clubbing.   Neuro Mental Status AAOx3  Language: Intact Fluency, Comprehension, Naming  Cranial Nerves II: Nor abnormalities in visual fields. III, IV, VI: EOM intact, no gaze preference or deviation, no nystagmus  V: Normal sensation in V1, V2, and V3 segments bilaterally  VII: Facial symmetry at rest and during various facial expressions VIII: Normal hearing to speech  IX, X: Normal palatal elevation, no uvular deviation  XI: 5/5 head turn and 5/5 shoulder shrug bilaterally XII: Symmetric tongue movement, tongue is midline without atrophy or fasciculations.   Motor Strength R UE: 5/5 L UE: 5/5 R LE: 5/5 L LE: 5/5  Sensory  Intact sensation of UE and LE bilaterally.   Coordination/Complex Motor:  Finger to Nose intact bilaterally Heel to shin intact bilaterally Rapid alternating movement are normal Gait: Deferred.   ASSESSMENT/PLAN Ms. Valerie Crawford is a 71 y.o. female with history of  PMH significant for hypothyroidism who presents with sudden onset slurred speech, tingling in LUE and then spread to LLE extremity with left sided weakness.Now with complete resolution of these symptoms on evaluation.   Stroke: Acute ischemia along the posterior limb of  the right internal capsule Etiology: Likely small vessel disease Code Stroke: CT head. No acute abnormality. Atrophy. ASPECTS 10.    MRI: Small focus of acute ischemia along the posterior limb of the right internal capsule. No hemorrhage or mass effect. MRA:  No acute abnormality 2D Echo EF 60 to 65% LDL 155 HgbA1c 5.7 VTE prophylaxis - Lovenox No antithrombotic prior to admission, now on aspirin 81 mg daily and clopidogrel 75 mg daily DAPT for 3 weeks and then aspirin alone Therapy recommendations: None Disposition: Home  Hypertension Home meds:  none Stable Long-term BP goal normotensive  Hyperlipidemia Home meds:  none LDL 155, goal < 70 Declined statins and zetia given history of  intolerance Continue follow-up with Dr. Rennis Golden in lipid clinic  Other Stroke Risk Factors Advanced Age >/= 16   Other Active Problems Headaches - recommend Tylenol instead given on DAPT Hypothyroidism - Synthroid 75 mcg   Hospital day # 0  Neurology will sign off. Please call with questions. Pt will follow up with stroke clinic NP at [facility] in about 4 weeks. Thanks for the consult.  Marvel Plan, MD PhD Stroke Neurology 07/30/2022 6:59 PM   To contact Stroke Continuity provider, please refer to [email or web address]. After hours, contact General Neurology

## 2022-07-30 NOTE — Plan of Care (Signed)

## 2022-07-30 NOTE — Care Management Obs Status (Signed)
[facility] NOTIFICATION   Patient Details  Name: CHRISTY EHRSAM MRN: [id number] Date of Birth: Sep 24, 1951   Medicare Observation Status Notification Given:  Yes    Carles Collet, RN 07/30/2022, 11:33 AM

## 2022-08-02 ENCOUNTER — Ambulatory Visit (INDEPENDENT_AMBULATORY_CARE_PROVIDER_SITE_OTHER): Payer: Medicare Other

## 2022-08-02 DIAGNOSIS — J309 Allergic rhinitis, unspecified: Secondary | ICD-10-CM | POA: Diagnosis not present

## 2022-08-04 ENCOUNTER — Ambulatory Visit (INDEPENDENT_AMBULATORY_CARE_PROVIDER_SITE_OTHER): Payer: Medicare Other

## 2022-08-04 DIAGNOSIS — J309 Allergic rhinitis, unspecified: Secondary | ICD-10-CM

## 2022-08-09 ENCOUNTER — Ambulatory Visit (INDEPENDENT_AMBULATORY_CARE_PROVIDER_SITE_OTHER): Payer: Medicare Other | Admitting: *Deleted

## 2022-08-09 DIAGNOSIS — J309 Allergic rhinitis, unspecified: Secondary | ICD-10-CM

## 2022-08-11 ENCOUNTER — Ambulatory Visit (INDEPENDENT_AMBULATORY_CARE_PROVIDER_SITE_OTHER): Payer: Medicare Other

## 2022-08-11 DIAGNOSIS — J309 Allergic rhinitis, unspecified: Secondary | ICD-10-CM

## 2022-08-16 ENCOUNTER — Ambulatory Visit (INDEPENDENT_AMBULATORY_CARE_PROVIDER_SITE_OTHER): Payer: Medicare Other | Admitting: Adult Health

## 2022-08-16 ENCOUNTER — Ambulatory Visit (INDEPENDENT_AMBULATORY_CARE_PROVIDER_SITE_OTHER): Payer: Medicare Other

## 2022-08-16 ENCOUNTER — Encounter: Payer: Self-pay | Admitting: Adult Health

## 2022-08-16 VITALS — BP 135/72 | HR 67 | Ht 63.5 in | Wt 170.5 lb

## 2022-08-16 DIAGNOSIS — J309 Allergic rhinitis, unspecified: Secondary | ICD-10-CM

## 2022-08-16 DIAGNOSIS — Z09 Encounter for follow-up examination after completed treatment for conditions other than malignant neoplasm: Secondary | ICD-10-CM

## 2022-08-16 DIAGNOSIS — I639 Cerebral infarction, unspecified: Secondary | ICD-10-CM | POA: Diagnosis not present

## 2022-08-16 NOTE — Progress Notes (Signed)
[facility] [address] [place]. [place] [address] [phone]       HOSPITAL FOLLOW UP NOTE  Ms. Valerie Crawford Date of Birth:  [birth year] Medical Record Number:  [id number]   Primary neurologist: Dr. Leonie Man Reason for Referral:  hospital stroke follow up    SUBJECTIVE:   CHIEF COMPLAINT:  Chief Complaint  Patient presents with   Hospitalization Follow-up stroke    Pt reports feeling really good. She states she is still experiencing fatigue, some headaches, and dizziness. She states too much visual stimulation gives her headaches. Room 3 alone    HPI:   [name] is a 71 y.o. female with history of  PMH significant for hypothyroidism who presented on 07/28/2022 with sudden onset slurred speech, tingling in LUE and then spread to LLE extremity with left sided weakness. Now with complete resolution of these symptoms on evaluation.  Patient reviewed hospitalization Fibryga signs, lab work and imaging.  Evaluated by Dr. Erlinda Hong for acute ischemia along the posterior limb of the right internal capsule likely secondary to small vessel disease.  Recommended DAPT for 3 weeks and aspirin alone.  LDL 155 -declines statins or Zetia given history of intolerance and advised outpatient follow-up with Dr. Debara Pickett in lipid clinic.  No prior stroke history.  Discharged home in stable condition without therapy needs.   Today, 08/16/2022, patient is being seen for initial hospital follow-up unaccompanied.  She has been doing well since discharge without any new stroke/TIA symptoms. Does have some issues with visual overstimulation, will cause mild headache at times. Does have some dizziness and fatigue as well.   Remains on DAPT, will complete Plavix duration in 2 days then remain on asa alone.  Blood pressure well controlled.  She has scheduled cardiology visit tomorrow and plans on further discussing cholesterol management regimen.  She is scheduled to see PCP this  afternoon.     PERTINENT IMAGING  Per hospitalization 07/28/2022 Code Stroke: CT head. No acute abnormality. Atrophy. ASPECTS 10.    MRI: Small focus of acute ischemia along the posterior limb of the right internal capsule. No hemorrhage or mass effect. MRA:  No acute abnormality 2D Echo EF 60 to 65% LDL 155 HgbA1c 5.7   ROS:   14 system review of systems performed and negative with exception of those listed in HPI  PMH:  Past Medical History:  Diagnosis Date   Allergies    Thyroid disease     PSH:  Past Surgical History:  Procedure Laterality Date   TONSILLECTOMY      Social History:  Social History   Socioeconomic History   Marital status: Divorced    Spouse name: Not on file   Number of children: Not on file   Years of education: Not on file   Highest education level: Not on file  Occupational History   Not on file  Tobacco Use   Smoking status: Former    Types: Cigarettes    Quit date: 12/16/1968    Years since quitting: 53.7   Smokeless tobacco: Former  Scientific laboratory technician Use: Never used  Substance and Sexual Activity   Alcohol use: Not Currently   Drug use: Never   Sexual activity: Not on file  Other Topics Concern   Not on file  Social History Narrative   Not on file   Social Determinants of Health   Financial Resource Strain: Not on file  Food Insecurity: Not on file  Transportation Needs:  Not on file  Physical Activity: Not on file  Stress: Not on file  Social Connections: Not on file  Intimate Partner Violence: Not on file    Family History:  Family History  Problem Relation Age of Onset   Hyperlipidemia Mother    Arthritis Father     Medications:   Current Outpatient Medications on File Prior to Visit  Medication Sig Dispense Refill   aspirin 81 MG chewable tablet Chew 1 tablet (81 mg total) by mouth daily. 30 tablet 3   clopidogrel (PLAVIX) 75 MG tablet Take 1 tablet (75 mg total) by mouth daily for 19 days. Take aspirin  along with Plavix for 19 more days, then stop Plavix and continue with aspirin indefinitely 19 tablet 0   EPINEPHrine 0.3 mg/0.3 mL IJ SOAJ injection Inject 0.3 mLs (0.3 mg total) into the muscle as needed for anaphylaxis. 2 each 2   fluticasone (FLONASE) 50 MCG/ACT nasal spray Place 1-2 sprays into the nose at bedtime.     levothyroxine (SYNTHROID) 75 MCG tablet Take 75 mcg by mouth daily before breakfast.     No current facility-administered medications on file prior to visit.    Allergies:   Allergies  Allergen Reactions   Other     Contrast dye - rash   Statins     Flu like syndrome      OBJECTIVE:  Physical Exam  Vitals:   08/16/22 0907  BP: 135/72  Pulse: 67  Weight: 170 lb 8 oz (77.3 kg)  Height: 5' 3.5" (1.613 m)   Body mass index is 29.73 kg/m. No results found.  Poststroke PHQ 2/9    08/16/2022   10:12 AM  Depression screen PHQ 2/9  Decreased Interest 0  Down, Depressed, Hopeless 0  PHQ - 2 Score 0     General: well developed, well nourished, very pleasant elderly Caucasian female, seated, in no evident distress Head: head normocephalic and atraumatic.   Neck: supple with no carotid or supraclavicular bruits Cardiovascular: regular rate and rhythm, no murmurs Musculoskeletal: no deformity Skin:  no rash/petichiae Vascular:  Normal pulses all extremities   Neurologic Exam Mental Status: Awake and fully alert.  Fluent speech and language.  Oriented to place and time. Recent and remote memory intact. Attention span, concentration and fund of knowledge appropriate. Mood and affect appropriate.  Cranial Nerves: Fundoscopic exam reveals sharp disc margins. Pupils equal, briskly reactive to light. Extraocular movements full without nystagmus. Visual fields full to confrontation. Hearing intact. Facial sensation intact. Face, tongue, palate moves normally and symmetrically.  Motor: Normal bulk and tone. Normal strength in all tested extremity muscles Sensory.:  intact to touch , pinprick , position and vibratory sensation.  Coordination: Rapid alternating movements normal in all extremities. Finger-to-nose and heel-to-shin performed accurately bilaterally. Gait and Station: Arises from chair without difficulty. Stance is normal. Gait demonstrates normal stride length and balance without use of AD.  Reflexes: 1+ and symmetric. Toes downgoing.     NIHSS  0 Modified Rankin  1      ASSESSMENT: KEELEE HANKERSON is a 71 y.o. year old female with right PLIC stroke on [id number] likely secondary to small vessel disease. Vascular risk factors include HLD and advanced age.      PLAN:  Right PLIC stroke:  Residual deficit: Recovered well functionally. Still some fatigue and mild headaches. Discussed typical recovery time. May need to consider headache prophylactic therapy if they persist Continue aspirin 81mg  daily for secondary stroke prevention. Complete plavix  in 2 days Follow up with cardiology as scheduled tomorrow to discuss cholesterol management, hx of intolerance to multiple statins Discussed secondary stroke prevention measures and importance of close PCP follow up for aggressive stroke risk factor management including BP goal<130/90, HLD with LDL goal<70 and DM with A1c.<7 .  Stroke labs 07/2022: LDL 155, A1c 5.7 I have gone over the pathophysiology of stroke, warning signs and symptoms, risk factors and their management in some detail with instructions to go to the closest emergency room for symptoms of concern.    Doing well from stroke standpoint without further recommendations and risk factors are managed by PCP/cardiology. She may follow up PRN, as usual for our patients who are strictly being followed for stroke. If any new neurological issues should arise, request PCP place referral for evaluation by one of our neurologists. Thank you.     CC:  [facility] provider: Dr. Leonie Man PCP: Lurline Del, DO    I spent a prolonged 70 minutes of  face-to-face and non-face-to-face time with patient.  This included previsit chart review including review of recent hospitalization, lab review, study review, electronic health record documentation, patient education and prolonged discussion regarding recent stroke including etiology, secondary stroke prevention measures and importance of managing stroke risk factors, residual deficits and typical recovery time and answered all other multiple questions to patient satisfaction  Frann Rider, AGNP-BC  [facility] [address] [place], [place] [address]  Phone [phone] Fax [phone] Note: This document was prepared with digital dictation and possible smart phrase technology. Any transcriptional errors that result from this process are unintentional.

## 2022-08-16 NOTE — Patient Instructions (Signed)
Continue aspirin 81 mg daily  for secondary stroke prevention  Complete plavix in the next 2 days then stop  Follow up with cardiology tomorrow to discuss cholesterol management   Continue to follow up with PCP regarding cholesterol management  Maintain strict control of cholesterol with LDL cholesterol (bad cholesterol) goal below 70 mg/dL.   Signs of a Stroke? Follow the BEFAST method:  Balance Watch for a sudden loss of balance, trouble with coordination or vertigo Eyes Is there a sudden loss of vision in one or both eyes? Or double vision?  Face: Ask the person to smile. Does one side of the face droop or is it numb?  Arms: Ask the person to raise both arms. Does one arm drift downward? Is there weakness or numbness of a leg? Speech: Ask the person to repeat a simple phrase. Does the speech sound slurred/strange? Is the person confused ? Time: If you observe any of these signs, call 911.       Thank you for coming to see [place] at [facility]. I hope we have been able to provide you high quality care today.  You may receive a patient satisfaction survey over the next few weeks. We would appreciate your feedback and comments so that we may continue to improve ourselves and the health of our patients.     Stroke Prevention Some medical conditions and lifestyle choices can lead to a higher risk for a stroke. You can help to prevent a stroke by eating healthy foods and exercising. It also helps to not smoke and to manage any health problems you may have. How can this condition affect me? A stroke is an emergency. It should be treated right away. A stroke can lead to brain damage or threaten your life. There is a better chance of surviving and getting better after a stroke if you get medical help right away. What can increase my risk? The following medical conditions may increase your risk of a stroke: Diseases of the heart and blood vessels (cardiovascular  disease). High blood pressure (hypertension). Diabetes. High cholesterol. Sickle cell disease. Problems with blood clotting. Being very overweight. Sleeping problems (obstructivesleep apnea). Other risk factors include: Being older than age 71. A history of blood clots, stroke, or mini-stroke (TIA). Race, ethnic background, or a family history of stroke. Smoking or using tobacco products. Taking birth control pills, especially if you smoke. Heavy alcohol and drug use. Not being active. What actions can I take to prevent this? Manage your health conditions High cholesterol. Eat a healthy diet. If this is not enough to manage your cholesterol, you may need to take medicines. Take medicines as told by your doctor. High blood pressure. Try to keep your blood pressure below 130/80. If your blood pressure cannot be managed through a healthy diet and regular exercise, you may need to take medicines. Take medicines as told by your doctor. Ask your doctor if you should check your blood pressure at home. Have your blood pressure checked every year. Diabetes. Eat a healthy diet and get regular exercise. If your blood sugar (glucose) cannot be managed through diet and exercise, you may need to take medicines. Take medicines as told by your doctor. Talk to your doctor about getting checked for sleeping problems. Signs of a problem can include: Snoring a lot. Feeling very tired. Make sure that you manage any other conditions you have. Nutrition  Follow instructions from your doctor about what to eat or drink. You may be  told to: Eat and drink fewer calories each day. Limit how much salt (sodium) you use to 1,500 milligrams (mg) each day. Use only healthy fats for cooking, such as olive oil, canola oil, and sunflower oil. Eat healthy foods. To do this: Choose foods that are high in fiber. These include whole grains, and fresh fruits and vegetables. Eat at least 5 servings of fruits and  vegetables a day. Try to fill one-half of your plate with fruits and vegetables at each meal. Choose low-fat (lean) proteins. These include low-fat cuts of meat, chicken without skin, fish, tofu, beans, and nuts. Eat low-fat dairy products. Avoid foods that: Are high in salt. Have saturated fat. Have trans fat. Have cholesterol. Are processed or pre-made. Count how many carbohydrates you eat and drink each day. Lifestyle If you drink alcohol: Limit how much you have to: 0-1 drink a day for women who are not pregnant. 0-2 drinks a day for men. Know how much alcohol is in your drink. In the [place] one drink equals one 12 oz bottle of beer ( ), one 5 oz glass of wine ( ), or one 1 oz glass of hard liquor (55mL). Do not smoke or use any products that have nicotine or tobacco. If you need help quitting, ask your doctor. Avoid secondhand smoke. Do not use drugs. Activity  Try to stay at a healthy weight. Get at least 30 minutes of exercise on most days, such as: Fast walking. Biking. Swimming. Medicines Take over-the-counter and prescription medicines only as told by your doctor. Avoid taking birth control pills. Talk to your doctor about the risks of taking birth control pills if: You are over 70 years old. You smoke. You get very bad headaches. You have had a blood clot. Where to find more information American Stroke Association: www.strokeassociation.org Get help right away if: You or a loved one has any signs of a stroke. "BE FAST" is an easy way to remember the warning signs: B - Balance. Dizziness, sudden trouble walking, or loss of balance. E - Eyes. Trouble seeing or a change in how you see. F - Face. Sudden weakness or loss of feeling of the face. The face or eyelid may droop on one side. A - Arms. Weakness or loss of feeling in an arm. This happens all of a sudden and most often on one side of the body. S - Speech. Sudden trouble speaking, slurred speech, or  trouble understanding what people say. T - Time. Time to call emergency services. Write down what time symptoms started. You or a loved one has other signs of a stroke, such as: A sudden, very bad headache with no known cause. Feeling like you may vomit (nausea). Vomiting. A seizure. These symptoms may be an emergency. Get help right away. Call your local emergency services (911 in the [place] Do not wait to see if the symptoms will go away. Do not drive yourself to the hospital. Summary You can help to prevent a stroke by eating healthy, exercising, and not smoking. It also helps to manage any health problems you have. Do not smoke or use any products that contain nicotine or tobacco. Get help right away if you or a loved one has any signs of a stroke. This information is not intended to replace advice given to you by your health care provider. Make sure you discuss any questions you have with your health care provider. Document Revised: 06/01/2020 Document Reviewed: 06/01/2020 Elsevier Patient Education  2023 [employer].

## 2022-08-17 ENCOUNTER — Ambulatory Visit: Payer: Medicare Other | Attending: Cardiology | Admitting: Nurse Practitioner

## 2022-08-17 ENCOUNTER — Encounter: Payer: Self-pay | Admitting: Nurse Practitioner

## 2022-08-17 VITALS — BP 136/80 | HR 65 | Ht 63.5 in | Wt 171.0 lb

## 2022-08-17 DIAGNOSIS — Z8673 Personal history of transient ischemic attack (TIA), and cerebral infarction without residual deficits: Secondary | ICD-10-CM | POA: Diagnosis not present

## 2022-08-17 DIAGNOSIS — E785 Hyperlipidemia, unspecified: Secondary | ICD-10-CM | POA: Insufficient documentation

## 2022-08-17 DIAGNOSIS — I10 Essential (primary) hypertension: Secondary | ICD-10-CM | POA: Insufficient documentation

## 2022-08-17 DIAGNOSIS — R7303 Prediabetes: Secondary | ICD-10-CM | POA: Diagnosis not present

## 2022-08-17 NOTE — Progress Notes (Signed)
I agree with the above plan 

## 2022-08-17 NOTE — Progress Notes (Signed)
Office Visit    Patient Name: Valerie Crawford Date of Encounter: 08/17/2022  Primary Care Provider:  Lurline Del, DO Primary Cardiologist:  Pixie Casino, MD  Chief Complaint    71 year old female with a history of CVA, hypertension, hyperlipidemia, and hypothyroidism who presents for follow-up related to hyperlipidemia.  Past Medical History    Past Medical History:  Diagnosis Date   Allergies    Thyroid disease    Past Surgical History:  Procedure Laterality Date   TONSILLECTOMY      Allergies  Allergies  Allergen Reactions   Other     Contrast dye - rash   Statins     Flu like syndrome   Iodine Rash    History of Present Illness    71 year old female with the above past medical history including CVA, hypertension, hyperlipidemia, and hypothyroidism.  She was initially evaluated by Dr. Debara Pickett in 2021 for the management of dyslipidemia statin intolerance.  Additionally she did not tolerate Zetia.  Coronary calcium score was 0 in 12/2019.  She was last seen in the office on 03/02/2020 and was started on Nexletol.  She has not been seen in follow-up since.  She was hospitalized in September 2023 in the setting of acute CVA.  MRI of the brain showed an area of small focus of acute ischemia along the posterior limb of the right internal capsule.  BP was elevated.  Echocardiogram showed normal EF, no RWMA, no intracardiac source of emboli, very small interatrial shunt on bubble study.  Her stroke was thought to be secondary to hypertension. Aspirin and Plavix were recommended for 3 weeks followed by aspirin alone. She was discharged home in stable condition on 07/30/2022.  Outpatient follow-up with cardiology was advised for ongoing management of hyperlipidemia.  She presents today for follow-up.  Since her hospitalization she has been stable from a cardiac standpoint.  She notes ongoing intermittent dizziness, mild headache. She denies palpitations, presyncope, syncope, chest  pain, dyspnea.  She is getting ready to attend her daughter's wedding on October 14 and is very much looking forward to this.  She understands that she is in need of more aggressive lipid-lowering therapy.  Other than her ongoing headache and dizziness, she denies any additional concerns today.  Home Medications    Current Outpatient Medications  Medication Sig Dispense Refill   aspirin 81 MG chewable tablet Chew 1 tablet (81 mg total) by mouth daily. 30 tablet 3   clopidogrel (PLAVIX) 75 MG tablet Take 1 tablet (75 mg total) by mouth daily for 19 days. Take aspirin along with Plavix for 19 more days, then stop Plavix and continue with aspirin indefinitely 19 tablet 0   EPINEPHrine 0.3 mg/0.3 mL IJ SOAJ injection Inject 0.3 mLs (0.3 mg total) into the muscle as needed for anaphylaxis. 2 each 2   fluticasone (FLONASE) 50 MCG/ACT nasal spray Place 1-2 sprays into the nose at bedtime.     levothyroxine (SYNTHROID) 75 MCG tablet Take 75 mcg by mouth daily before breakfast.     No current facility-administered medications for this visit.     Review of Systems    She denies chest pain, palpitations, dyspnea, pnd, orthopnea, n, v, syncope, edema, weight gain, or early satiety. All other systems reviewed and are otherwise negative except as noted above.   Physical Exam    VS:  BP 136/80 (BP Location: Left Arm, Patient Position: Sitting, Cuff Size: Large)   Pulse 65   Ht 5' 3.5" (1.613  m)   Wt 171 lb (77.6 kg)   BMI 29.82 kg/m  GEN: Well nourished, well developed, in no acute distress. HEENT: normal. Neck: Supple, no JVD, carotid bruits, or masses. Cardiac: RRR, no murmurs, rubs, or gallops. No clubbing, cyanosis, edema.  Radials/DP/PT 2+ and equal bilaterally.  Respiratory:  Respirations regular and unlabored, clear to auscultation bilaterally. GI: Soft, nontender, nondistended, BS + x 4. MS: no deformity or atrophy. Skin: warm and dry, no rash. Neuro:  Strength and sensation are  intact. Psych: Normal affect.  Accessory Clinical Findings   ECG personally reviewed by me today -NSR, 65 bpm- no acute changes.   Lab Results  Component Value Date   WBC 6.7 07/28/2022   HGB 14.6 07/28/2022   HCT 43.0 07/28/2022   MCV 89.7 07/28/2022   PLT 311 07/28/2022   Lab Results  Component Value Date   CREATININE 0.80 07/28/2022   BUN 15 07/28/2022   NA 139 07/28/2022   K 3.9 07/28/2022   CL 103 07/28/2022   CO2 25 07/28/2022   Lab Results  Component Value Date   ALT 30 07/28/2022   AST 29 07/28/2022   ALKPHOS 54 07/28/2022   BILITOT 0.2 (L) 07/28/2022   Lab Results  Component Value Date   CHOL 248 (H) 07/28/2022   HDL 54 07/28/2022   LDLCALC 155 (H) 07/28/2022   TRIG 194 (H) 07/28/2022   CHOLHDL 4.6 07/28/2022    Lab Results  Component Value Date   HGBA1C 5.7 (H) 07/28/2022    Assessment & Plan    1. Hyperlipidemia: LDL was 155 in 07/2022. Intolerant to statins and Zetia.  Was previously prescribed Nexletol but never started this as she was concerned about side effects. We discussed possible introduction of PCSK9 inhibitor.  She would like to discuss this further with Dr. Debara Pickett. Given history of recent CVA, will get her scheduled for follow-up with Dr. Debara Pickett in the lipid clinic as soon as possible.   2. Hypertension: BP well controlled.  He is not on any antihypertensive medication at this time.  Continue to monitor BP in the setting of recent CVA.  3. History of CVA: MRI of the brain showed an area of small focus of acute ischemia along the posterior limb of the right internal capsule. Echo showed normal EF, no RWMA, no intracardiac source of emboli, very small interatrial shunt on bubble study. She continues to note intermittent dizziness and mild headache.  Following with neurology. Continue aspirin, Plavix per neurology.  Follow-up with Dr. Debara Pickett in the lipid clinic as above.  4. Prediabetes: A1c was 5.7 07/2022.  Continue to follow with PCP.  5.  Disposition: Follow-up as soon as possible with Dr. Debara Pickett (1 month).     Lenna Sciara, NP 08/17/2022, 3:01 PM

## 2022-08-17 NOTE — Patient Instructions (Signed)
Medication Instructions:  Your physician recommends that you continue on your current medications as directed. Please refer to the Current Medication list given to you today.   *If you need a refill on your cardiac medications before your next appointment, please call your pharmacy*   Lab Work: NONE ordered at this time of appointment   If you have labs (blood work) drawn today and your tests are completely normal, you will receive your results only by: [place] (if you have MyChart) OR A paper copy in the mail If you have any lab test that is abnormal or we need to change your treatment, we will call you to review the results.   Testing/Procedures: NONE ordered at this time of appointment     Follow-Up: At [facility], you and your health needs are our priority.  As part of our continuing mission to provide you with exceptional heart care, we have created designated Provider Care Teams.  These Care Teams include your primary Cardiologist (physician) and Advanced Practice Providers (APPs -  Physician Assistants and Nurse Practitioners) who all work together to provide you with the care you need, when you need it.  We recommend signing up for the patient portal called "MyChart".  Sign up information is provided on this After Visit Summary.  MyChart is used to connect with patients for Virtual Visits (Telemedicine).  Patients are able to view lab/test results, encounter notes, upcoming appointments, etc.  Non-urgent messages can be sent to your provider as well.   To learn more about what you can do with MyChart, go to [email or web address].    Your next appointment:   Urgent appointment   The format for your next appointment:   In Person  Provider:   Pixie Casino, MD     Other Instructions   Important Information About Sugar

## 2022-08-18 ENCOUNTER — Ambulatory Visit (INDEPENDENT_AMBULATORY_CARE_PROVIDER_SITE_OTHER): Payer: Medicare Other

## 2022-08-18 DIAGNOSIS — J309 Allergic rhinitis, unspecified: Secondary | ICD-10-CM

## 2022-08-23 ENCOUNTER — Ambulatory Visit (INDEPENDENT_AMBULATORY_CARE_PROVIDER_SITE_OTHER): Payer: Medicare Other | Admitting: *Deleted

## 2022-08-23 DIAGNOSIS — J309 Allergic rhinitis, unspecified: Secondary | ICD-10-CM

## 2022-08-26 ENCOUNTER — Ambulatory Visit (INDEPENDENT_AMBULATORY_CARE_PROVIDER_SITE_OTHER): Payer: Medicare Other | Admitting: *Deleted

## 2022-08-26 DIAGNOSIS — J309 Allergic rhinitis, unspecified: Secondary | ICD-10-CM | POA: Diagnosis not present

## 2022-08-30 ENCOUNTER — Ambulatory Visit (INDEPENDENT_AMBULATORY_CARE_PROVIDER_SITE_OTHER): Payer: Medicare Other

## 2022-08-30 DIAGNOSIS — J309 Allergic rhinitis, unspecified: Secondary | ICD-10-CM | POA: Diagnosis not present

## 2022-09-01 ENCOUNTER — Ambulatory Visit (INDEPENDENT_AMBULATORY_CARE_PROVIDER_SITE_OTHER): Payer: Medicare Other | Admitting: *Deleted

## 2022-09-01 DIAGNOSIS — J309 Allergic rhinitis, unspecified: Secondary | ICD-10-CM | POA: Diagnosis not present

## 2022-09-06 ENCOUNTER — Ambulatory Visit (INDEPENDENT_AMBULATORY_CARE_PROVIDER_SITE_OTHER): Payer: Medicare Other | Admitting: *Deleted

## 2022-09-06 DIAGNOSIS — J309 Allergic rhinitis, unspecified: Secondary | ICD-10-CM | POA: Diagnosis not present

## 2022-09-12 ENCOUNTER — Ambulatory Visit: Payer: Medicare Other | Admitting: Internal Medicine

## 2022-09-13 ENCOUNTER — Ambulatory Visit (INDEPENDENT_AMBULATORY_CARE_PROVIDER_SITE_OTHER): Payer: Medicare Other | Admitting: *Deleted

## 2022-09-13 DIAGNOSIS — J309 Allergic rhinitis, unspecified: Secondary | ICD-10-CM

## 2022-09-20 ENCOUNTER — Ambulatory Visit (INDEPENDENT_AMBULATORY_CARE_PROVIDER_SITE_OTHER): Payer: Medicare Other

## 2022-09-20 DIAGNOSIS — J309 Allergic rhinitis, unspecified: Secondary | ICD-10-CM | POA: Diagnosis not present

## 2022-09-27 ENCOUNTER — Ambulatory Visit: Payer: Medicare Other | Attending: Internal Medicine | Admitting: Internal Medicine

## 2022-09-27 ENCOUNTER — Telehealth: Payer: Self-pay | Admitting: Internal Medicine

## 2022-09-27 ENCOUNTER — Ambulatory Visit (INDEPENDENT_AMBULATORY_CARE_PROVIDER_SITE_OTHER): Payer: Medicare Other

## 2022-09-27 ENCOUNTER — Encounter: Payer: Self-pay | Admitting: Internal Medicine

## 2022-09-27 VITALS — BP 144/79 | HR 74 | Ht 63.0 in | Wt 169.0 lb

## 2022-09-27 DIAGNOSIS — J309 Allergic rhinitis, unspecified: Secondary | ICD-10-CM

## 2022-09-27 DIAGNOSIS — I639 Cerebral infarction, unspecified: Secondary | ICD-10-CM

## 2022-09-27 DIAGNOSIS — Z789 Other specified health status: Secondary | ICD-10-CM | POA: Diagnosis not present

## 2022-09-27 DIAGNOSIS — E785 Hyperlipidemia, unspecified: Secondary | ICD-10-CM | POA: Insufficient documentation

## 2022-09-27 DIAGNOSIS — Z8673 Personal history of transient ischemic attack (TIA), and cerebral infarction without residual deficits: Secondary | ICD-10-CM | POA: Insufficient documentation

## 2022-09-27 NOTE — Telephone Encounter (Signed)
Patient seen in office 09/27/22 Dr. Rennis Golden discussed Valerie Crawford with her Her insurance has optimal coverage for medication  Plan G covers deductible + coinsurance  Benefits investigation form faxed to [phone]

## 2022-09-27 NOTE — Progress Notes (Signed)
LIPID CLINIC CONSULT NOTE  Chief Complaint:  Follow-up dyslipidemia  Primary Care Physician: Jackelyn Poling, DO  Primary Cardiologist:  Chrystie Nose, MD  HPI:  Valerie Crawford is a 71 y.o. female who is being seen today for the evaluation of dyslipidemia at the request of Jackelyn Poling, DO.  This is a pleasant 71 year old female kindly referred for evaluation and management of dyslipidemia.  She reports this is been a longstanding problem and she is seen multiple doctors, prior cardiologist and other providers and management of this both out in [place] and more recently when she was living in [place].  She is recently retired and was referred for further evaluation and management.  She brought records with her from the past 5 or 6 years which showed significant dyslipidemia.  Only at 1 point where she able to reduce her LDL cholesterol well below 200 with significant dietary changes.  She was on the "O blood type" diet.  Unfortunately she has not been able to tolerate statins, all caused flulike symptoms, at least 3 statins and to high potency statins in the past.  She also recently tried ezetimibe and said overall she had flulike symptoms, myalgias and just felt "bad".  Her most recent lipid profile showed total cholesterol 329, triglycerides 342, HDL 46 and LDL of 212.  She reports her mother had significant dyslipidemia however had no early onset heart disease.  02/22/2020  Valerie Crawford returns today for follow-up.  Only approved for Nexletol as an alternative for management of dyslipidemia.  After much discussion with her, she has not started the medication.  She says she has a lot of hesitation about the possible side effects of medication including risk for upper respiratory infection.  I told her that in my experience so far over the past year as well as preclinical trial experience, this was extremely unlikely and perhaps only happened to 1 patient.  Nonetheless, she is still very  hesitant to use the medication.  09/27/2022  Valerie Crawford seen today in follow-up.  Unfortunately since I last saw her she has had a stroke.  She was intolerant to statins and we discussed the possibility of using Nexletol but she was concerned about side effects.  We also discussed PCSK9 inhibitors, but she had no identifiable cardiovascular disease and was concerned about side effects.  She now returns and does seem somewhat interested in further lipid-lowering although has significant concerns about possible side effects.  We discussed the possibility of PCSK9 inhibitors or Leqvio today.  Her insurance may be more likely to cover this.  In addition, she would really benefit from an LP(a) evaluation.  PMHx:  Past Medical History:  Diagnosis Date   Allergies    Thyroid disease     Past Surgical History:  Procedure Laterality Date   TONSILLECTOMY      FAMHx:  Family History  Problem Relation Age of Onset   Hyperlipidemia Mother    Arthritis Father     SOCHx:   reports that she quit smoking about 53 years ago. Her smoking use included cigarettes. She has quit using smokeless tobacco. She reports that she does not currently use alcohol. She reports that she does not use drugs.  ALLERGIES:  Allergies  Allergen Reactions   Other     Contrast dye - rash   Statins     Flu like syndrome   Iodine Rash    ROS: Pertinent items noted in HPI and remainder of comprehensive ROS otherwise negative.  HOME MEDS: Current Outpatient Medications on File Prior to Visit  Medication Sig Dispense Refill   EPINEPHrine 0.3 mg/0.3 mL IJ SOAJ injection Inject 0.3 mLs (0.3 mg total) into the muscle as needed for anaphylaxis. 2 each 2   fluticasone (FLONASE) 50 MCG/ACT nasal spray Place 1-2 sprays into the nose at bedtime.     levothyroxine (SYNTHROID) 75 MCG tablet Take 75 mcg by mouth daily before breakfast.     No current facility-administered medications on file prior to visit.     LABS/IMAGING: No results found for this or any previous visit (from the past 48 hour(s)). No results found.  LIPID PANEL:    Component Value Date/Time   CHOL 248 (H) 07/28/2022 2224   TRIG 194 (H) 07/28/2022 2224   HDL 54 07/28/2022 2224   CHOLHDL 4.6 07/28/2022 2224   VLDL 39 07/28/2022 2224   LDLCALC 155 (H) 07/28/2022 2224    WEIGHTS: Wt Readings from Last 3 Encounters:  09/27/22 169 lb (76.7 kg)  08/17/22 171 lb (77.6 kg)  08/16/22 170 lb 8 oz (77.3 kg)    VITALS: BP (!) 144/79 (BP Location: Left Arm, Patient Position: Sitting)   Pulse 74   Ht 5\' 3"  (1.6 m)   Wt 169 lb (76.7 kg)   SpO2 97%   BMI 29.94 kg/m   EXAM: Deferred  EKG: Deferred  ASSESSMENT: Possible familial combined hyperlipidemia History of stroke Statin intolerance  PLAN: 1.   Valerie Crawford unfortunately has had a stroke since I saw her last time.  Her cholesterol still remains high.  She needs at least a 50% reduction in LDL to reach target less than 70.  She cannot take statins.  She is a good candidate for PCSK9 inhibitor.  Her insurance would actually be likely to fully cover Leqvio which is an every 56-month option.  She seemed interested in this because of the lower risk of side effects compared to the antibody PCSK9 inhibitors.  We will pursue prior authorization and plan repeat lipids in about 4 to 6 months with follow-up afterwards.  8-month, MD, [facility], FACP  [place]  [facility]  Medical Director of the Advanced Lipid Disorders &  Cardiovascular Risk Reduction Clinic Diplomate of the American Board of Clinical Lipidology Attending Cardiologist  Direct Dial: [phone]  Fax: [phone]  Website:  [email or web address]  [phone] Willy Pinkerton 09/27/2022, 1:32 PM

## 2022-09-27 NOTE — Patient Instructions (Signed)
Medication Instructions:   We will check benefits for LEQVIO. This is administered once every 6 months after the initial loading dose of 2 injections, given 3 months apart.   Appointments at scheduled at [facility] off [address]  *If you need a refill on your cardiac medications before your next appointment, please call your pharmacy*   Lab Work: LPa today   FASTING lab work to check cholesterol before next appointment   If you have labs (blood work) drawn today and your tests are completely normal, you will receive your results only by: MyChart Message (if you have MyChart) OR A paper copy in the mail If you have any lab test that is abnormal or we need to change your treatment, we will call you to review the results.    Follow-Up: At [facility], you and your health needs are our priority.  As part of our continuing mission to provide you with exceptional heart care, we have created designated Provider Care Teams.  These Care Teams include your primary Cardiologist (physician) and Advanced Practice Providers (APPs -  Physician Assistants and Nurse Practitioners) who all work together to provide you with the care you need, when you need it.  We recommend signing up for the patient portal called "MyChart".  Sign up information is provided on this After Visit Summary.  MyChart is used to connect with patients for Virtual Visits (Telemedicine).  Patients are able to view lab/test results, encounter notes, upcoming appointments, etc.  Non-urgent messages can be sent to your provider as well.   To learn more about what you can do with MyChart, go to [email or web address].    Your next appointment:   5-6 months  The format for your next appointment:   In Person  Provider:   Zoila Shutter MD

## 2022-09-28 LAB — LIPOPROTEIN A (LPA): Lipoprotein (a): 8.5 nmol/L (ref <75.0)

## 2022-09-28 NOTE — Telephone Encounter (Signed)
Sue Lush of [employer] is requesting an NPI# for an alternate injection center location.  Phone#: [phone]

## 2022-09-30 ENCOUNTER — Encounter: Payer: Self-pay | Admitting: Internal Medicine

## 2022-09-30 NOTE — Telephone Encounter (Signed)
Spoke with [employer] [facility] [employer] center and provided NPI for [employer]

## 2022-10-04 ENCOUNTER — Ambulatory Visit (INDEPENDENT_AMBULATORY_CARE_PROVIDER_SITE_OTHER): Payer: Medicare Other

## 2022-10-04 DIAGNOSIS — J309 Allergic rhinitis, unspecified: Secondary | ICD-10-CM

## 2022-10-11 ENCOUNTER — Ambulatory Visit (INDEPENDENT_AMBULATORY_CARE_PROVIDER_SITE_OTHER): Payer: Medicare Other | Admitting: *Deleted

## 2022-10-11 DIAGNOSIS — J309 Allergic rhinitis, unspecified: Secondary | ICD-10-CM

## 2022-10-18 ENCOUNTER — Ambulatory Visit (INDEPENDENT_AMBULATORY_CARE_PROVIDER_SITE_OTHER): Payer: Medicare Other

## 2022-10-18 DIAGNOSIS — J309 Allergic rhinitis, unspecified: Secondary | ICD-10-CM

## 2022-10-19 DIAGNOSIS — J3089 Other allergic rhinitis: Secondary | ICD-10-CM | POA: Diagnosis not present

## 2022-10-19 NOTE — Progress Notes (Signed)
VIALS EXP 09-20-23 

## 2022-10-20 DIAGNOSIS — J302 Other seasonal allergic rhinitis: Secondary | ICD-10-CM | POA: Diagnosis not present

## 2022-10-20 NOTE — Telephone Encounter (Signed)
No updates in On[employer], cannot log into Leqvio portal currently either

## 2022-10-25 ENCOUNTER — Ambulatory Visit (INDEPENDENT_AMBULATORY_CARE_PROVIDER_SITE_OTHER): Payer: Medicare Other

## 2022-10-25 DIAGNOSIS — J309 Allergic rhinitis, unspecified: Secondary | ICD-10-CM | POA: Diagnosis not present

## 2022-10-26 ENCOUNTER — Encounter: Payer: Self-pay | Admitting: Internal Medicine

## 2022-10-26 DIAGNOSIS — E785 Hyperlipidemia, unspecified: Secondary | ICD-10-CM | POA: Insufficient documentation

## 2022-10-26 NOTE — Addendum Note (Signed)
Addended by: Lindell Spar on: 10/26/2022 08:49 AM   Modules accepted: Orders

## 2022-10-26 NOTE — Telephone Encounter (Signed)
Patient has been identified as candidate for Leqvio  Benefits investigation enrollment completed on 09/30/22 -- due to [facility] service center portal glitches, unable to retrieve info until this date  Benefits investigation report notes the following:  Type of insurance: medicare A&B, [employer] of [place] supplement plan G  OOP Max: none / Met: n/a  Deductible: $226/calendar year  Co-insurance: 20%  PA required: NO  PA phone number: n/a  Benefits Summary Details:  Primary: Valerie Crawford is covered at 80% after the $226 calendar year deductible is met and then there is a 20% coinsurance Secondary: [employer] of [place] supplement plan G covers 20% coinsurance, patient responsible for $226 deductible

## 2022-10-26 NOTE — Addendum Note (Signed)
Addended by: Lindell Spar on: 10/26/2022 09:40 AM   Modules accepted: Orders

## 2022-11-01 ENCOUNTER — Ambulatory Visit (INDEPENDENT_AMBULATORY_CARE_PROVIDER_SITE_OTHER): Payer: Medicare Other

## 2022-11-01 DIAGNOSIS — J309 Allergic rhinitis, unspecified: Secondary | ICD-10-CM | POA: Diagnosis not present

## 2022-11-09 ENCOUNTER — Ambulatory Visit (INDEPENDENT_AMBULATORY_CARE_PROVIDER_SITE_OTHER): Payer: Medicare Other

## 2022-11-09 DIAGNOSIS — J309 Allergic rhinitis, unspecified: Secondary | ICD-10-CM

## 2022-11-16 ENCOUNTER — Ambulatory Visit (INDEPENDENT_AMBULATORY_CARE_PROVIDER_SITE_OTHER): Payer: Medicare Other

## 2022-11-16 DIAGNOSIS — J309 Allergic rhinitis, unspecified: Secondary | ICD-10-CM

## 2022-11-23 ENCOUNTER — Ambulatory Visit (INDEPENDENT_AMBULATORY_CARE_PROVIDER_SITE_OTHER): Payer: Medicare Other

## 2022-11-23 DIAGNOSIS — J309 Allergic rhinitis, unspecified: Secondary | ICD-10-CM | POA: Diagnosis not present

## 2022-11-30 ENCOUNTER — Ambulatory Visit (INDEPENDENT_AMBULATORY_CARE_PROVIDER_SITE_OTHER): Payer: Medicare Other

## 2022-11-30 DIAGNOSIS — J309 Allergic rhinitis, unspecified: Secondary | ICD-10-CM

## 2022-12-07 ENCOUNTER — Ambulatory Visit (INDEPENDENT_AMBULATORY_CARE_PROVIDER_SITE_OTHER): Payer: Medicare Other

## 2022-12-07 DIAGNOSIS — J309 Allergic rhinitis, unspecified: Secondary | ICD-10-CM | POA: Diagnosis not present

## 2022-12-14 ENCOUNTER — Ambulatory Visit (INDEPENDENT_AMBULATORY_CARE_PROVIDER_SITE_OTHER): Payer: Medicare Other

## 2022-12-14 DIAGNOSIS — J309 Allergic rhinitis, unspecified: Secondary | ICD-10-CM | POA: Diagnosis not present

## 2022-12-21 ENCOUNTER — Ambulatory Visit (INDEPENDENT_AMBULATORY_CARE_PROVIDER_SITE_OTHER): Payer: Medicare Other

## 2022-12-21 DIAGNOSIS — J309 Allergic rhinitis, unspecified: Secondary | ICD-10-CM

## 2022-12-28 ENCOUNTER — Ambulatory Visit (INDEPENDENT_AMBULATORY_CARE_PROVIDER_SITE_OTHER): Payer: Medicare Other

## 2022-12-28 DIAGNOSIS — J309 Allergic rhinitis, unspecified: Secondary | ICD-10-CM | POA: Diagnosis not present

## 2023-01-10 ENCOUNTER — Other Ambulatory Visit: Payer: Self-pay | Admitting: Internal Medicine

## 2023-01-10 DIAGNOSIS — E785 Hyperlipidemia, unspecified: Secondary | ICD-10-CM

## 2023-01-11 ENCOUNTER — Ambulatory Visit (INDEPENDENT_AMBULATORY_CARE_PROVIDER_SITE_OTHER): Payer: Medicare Other

## 2023-01-11 DIAGNOSIS — J309 Allergic rhinitis, unspecified: Secondary | ICD-10-CM

## 2023-01-25 ENCOUNTER — Ambulatory Visit (INDEPENDENT_AMBULATORY_CARE_PROVIDER_SITE_OTHER): Payer: Medicare Other

## 2023-01-25 DIAGNOSIS — J309 Allergic rhinitis, unspecified: Secondary | ICD-10-CM

## 2023-02-07 DIAGNOSIS — J3089 Other allergic rhinitis: Secondary | ICD-10-CM | POA: Diagnosis not present

## 2023-02-07 NOTE — Progress Notes (Signed)
VIALS EXP 02-07-24

## 2023-02-08 DIAGNOSIS — J302 Other seasonal allergic rhinitis: Secondary | ICD-10-CM

## 2023-02-09 ENCOUNTER — Other Ambulatory Visit: Payer: Self-pay

## 2023-02-09 ENCOUNTER — Ambulatory Visit (INDEPENDENT_AMBULATORY_CARE_PROVIDER_SITE_OTHER): Payer: Medicare Other | Admitting: Allergy

## 2023-02-09 ENCOUNTER — Encounter: Payer: Self-pay | Admitting: Allergy

## 2023-02-09 VITALS — BP 150/80 | HR 69 | Temp 98.2°F | Ht 63.0 in | Wt 168.8 lb

## 2023-02-09 DIAGNOSIS — J3089 Other allergic rhinitis: Secondary | ICD-10-CM

## 2023-02-09 DIAGNOSIS — H1033 Unspecified acute conjunctivitis, bilateral: Secondary | ICD-10-CM | POA: Diagnosis not present

## 2023-02-09 DIAGNOSIS — J018 Other acute sinusitis: Secondary | ICD-10-CM

## 2023-02-09 MED ORDER — FLUTICASONE PROPIONATE 50 MCG/ACT NA SUSP: NASAL | 5 refills | Status: DC

## 2023-02-09 MED ORDER — AMOXICILLIN-POT CLAVULANATE 875-125 MG PO TABS: 1.0000 | ORAL_TABLET | Freq: Two times a day (BID) | ORAL | 0 refills | Status: AC

## 2023-02-09 MED ORDER — EPINEPHRINE 0.3 MG/0.3ML IJ SOAJ: 0.3000 mg | INTRAMUSCULAR | 2 refills | Status: AC | PRN

## 2023-02-09 NOTE — Patient Instructions (Addendum)
  1.  Continue immunotherapy per protocol.  Can get your shot next week  2.  Continue Flonase-1-2 sprays each nostril 1-7 times per week  3.  For current symptoms:  A. Try Xyzal 5mg  daily (samples provided)  B.  If you are not noting improvement in symptoms in next 2-3 days of Xyzal use then pick up Augmentin antibiotic and take 1 tab twice a day x 10 days for sinus infection treatment  4.  Continue EpiPen 0.3, Benadryl, MD/ER evaluation for allergic reaction  5.  Return to clinic in 1 year or earlier if problem

## 2023-02-09 NOTE — Progress Notes (Signed)
Follow-up Note  RE: Valerie Crawford MRN: [id number] DOB: 1951/10/16 Date of Office Visit: 02/09/2023   History of present illness: Valerie Crawford is a 72 y.o. female presenting today for allergy flare.  She has history of allergic rhinitis.  She follows with Dr Neldon Mc with most recent office visit on 05/04/22 by Dr Neldon Mc.    She states she has been having crusty eyes and goopy eyes that have been matted shut, sinus headache, fatigued and sleeping more.  Today she state she felt kinda achy.  Last week she states she woke up and eye was red and puffy and matted shut and she thought she had pink eye but it seems to spread a bit to the other eye.  Denies any fevers.  She states she has had history of sinus infections but has not had one in quite some time.  She is wondering if maybe her symptoms are related to a new allergen.  Her allergy shots consist of dust mites and mold.  She is wondering if she needs to have repeat updated testing done. She states she does use flonase.  She use to use allegra in the past but currently not on any antihistamines.     Review of systems: Review of Systems  Constitutional: Negative.   HENT: Negative.         See HPI  Eyes:        See HPI  Respiratory: Negative.    Cardiovascular: Negative.   Gastrointestinal: Negative.   Musculoskeletal: Negative.   Skin: Negative.   Allergic/Immunologic: Negative.   Neurological: Negative.      All other systems negative unless noted above in HPI  Past medical/social/surgical/family history have been reviewed and are unchanged unless specifically indicated below.  No changes  Medication List: Current Outpatient Medications  Medication Sig Dispense Refill   fluticasone (FLONASE) 50 MCG/ACT nasal spray Place 1-2 sprays into the nose at bedtime.     levothyroxine (SYNTHROID) 75 MCG tablet Take 75 mcg by mouth daily before breakfast.     EPINEPHrine 0.3 mg/0.3 mL IJ SOAJ injection Inject 0.3 mLs (0.3 mg total)  into the muscle as needed for anaphylaxis. (Patient not taking: Reported on 02/09/2023) 2 each 2   No current facility-administered medications for this visit.     Known medication allergies: Allergies  Allergen Reactions   Other     Contrast dye - rash   Statins     Flu like syndrome   Iodine Rash     Physical examination: Blood pressure (!) 140/88, pulse 69, temperature 98.2 F (36.8 C), height 5\' 3"  (1.6 m), weight 168 lb 12.8 oz (76.6 kg), SpO2 96 %.  General: Alert, interactive, in no acute distress. HEENT: PERRLA + tearing, TMs pearly gray, turbinates moderately edematous without discharge, post-pharynx non erythematous. Neck: Supple without lymphadenopathy. Lungs: Clear to auscultation without wheezing, rhonchi or rales. {no increased work of breathing. CV: Normal S1, S2 without murmurs. Abdomen: Nondistended, nontender. Skin: Warm and dry, without lesions or rashes. Extremities:  No clubbing, cyanosis or edema. Neuro:   Grossly intact.  Diagnositics/Labs: None today  Assessment and plan: Conjunctivitis, rhinitis, headache, fatigue, aches -concerned the symptoms are more than just a new environmental allergen.  Concern that she may have a bacterial illness that is driving her to have gingivitis with sinusitis.  I did discuss with her that we can try first with adding an antihistamine to her Flonase regimen if this resolves her symptoms then great and  would recommend updating her allergy testing to see if she has developed new allergen(s).  If the antihistamine does not seem to help symptoms over the next several days then I have sent in Augmentin that she can pick up from pharmacy and start. Allergic rhinitis  1.  Continue immunotherapy per protocol.  Can get your shot next week  2.  Continue Flonase-1-2 sprays each nostril 1-7 times per week  3.  For current symptoms:  A. Try Xyzal 5mg  daily (samples provided)  B.  If you are not noting improvement in symptoms in  next 2-3 days of Xyzal use then pick up Augmentin antibiotic and take 1 tab twice a day x 10 days for sinus infection treatment  4.  Continue EpiPen 0.3, Benadryl, MD/ER evaluation for allergic reaction  5.  Return to clinic in 1 year or earlier if problem  I appreciate the opportunity to take part in Jovanni's care. Please do not hesitate to contact me with questions.  Sincerely,   Prudy Feeler, MD Allergy/Immunology [facility] [place] [facility] [place]

## 2023-02-14 ENCOUNTER — Ambulatory Visit (INDEPENDENT_AMBULATORY_CARE_PROVIDER_SITE_OTHER): Payer: Medicare Other

## 2023-02-14 DIAGNOSIS — J309 Allergic rhinitis, unspecified: Secondary | ICD-10-CM | POA: Diagnosis not present

## 2023-02-28 ENCOUNTER — Ambulatory Visit (INDEPENDENT_AMBULATORY_CARE_PROVIDER_SITE_OTHER): Payer: Medicare Other

## 2023-02-28 DIAGNOSIS — J309 Allergic rhinitis, unspecified: Secondary | ICD-10-CM | POA: Diagnosis not present

## 2023-03-14 ENCOUNTER — Ambulatory Visit (INDEPENDENT_AMBULATORY_CARE_PROVIDER_SITE_OTHER): Payer: Medicare Other

## 2023-03-14 DIAGNOSIS — J309 Allergic rhinitis, unspecified: Secondary | ICD-10-CM | POA: Diagnosis not present

## 2023-03-28 ENCOUNTER — Ambulatory Visit (INDEPENDENT_AMBULATORY_CARE_PROVIDER_SITE_OTHER): Payer: Medicare Other

## 2023-03-28 DIAGNOSIS — J309 Allergic rhinitis, unspecified: Secondary | ICD-10-CM

## 2023-03-28 LAB — NMR, LIPOPROFILE
Cholesterol, Total: 325 mg/dL — ABNORMAL HIGH (ref 100–199)
HDL Particle Number: 36 umol/L (ref >=30.5)
HDL-C: 44 mg/dL (ref >39)
LDL Particle Number: 1483 nmol/L — ABNORMAL HIGH (ref <1000)
LDL Size: 19.7 nm — ABNORMAL LOW (ref >20.5)
LDL-C (NIH Calc): 208 mg/dL — ABNORMAL HIGH (ref 0–99)
LP-IR Score: 71 — ABNORMAL HIGH (ref <=45)
Small LDL Particle Number: 472 nmol/L (ref <=527)
Triglycerides: 354 mg/dL — ABNORMAL HIGH (ref 0–149)

## 2023-03-28 LAB — LIPOPROTEIN A (LPA): Lipoprotein (a): <8.4 nmol/L (ref <75.0)

## 2023-04-04 ENCOUNTER — Encounter: Payer: Self-pay | Admitting: Internal Medicine

## 2023-04-04 ENCOUNTER — Ambulatory Visit (INDEPENDENT_AMBULATORY_CARE_PROVIDER_SITE_OTHER): Payer: Medicare Other

## 2023-04-04 ENCOUNTER — Ambulatory Visit: Payer: Medicare Other | Attending: Internal Medicine | Admitting: Internal Medicine

## 2023-04-04 VITALS — BP 132/60 | HR 78 | Ht 63.0 in | Wt 163.8 lb

## 2023-04-04 DIAGNOSIS — J309 Allergic rhinitis, unspecified: Secondary | ICD-10-CM

## 2023-04-04 DIAGNOSIS — Z8673 Personal history of transient ischemic attack (TIA), and cerebral infarction without residual deficits: Secondary | ICD-10-CM

## 2023-04-04 DIAGNOSIS — Z789 Other specified health status: Secondary | ICD-10-CM | POA: Diagnosis present

## 2023-04-04 DIAGNOSIS — E785 Hyperlipidemia, unspecified: Secondary | ICD-10-CM | POA: Insufficient documentation

## 2023-04-04 NOTE — Patient Instructions (Signed)
Medication Instructions:  NO CHANGES  *If you need a refill on your cardiac medications before your next appointment, please call your pharmacy*    Follow-Up: At [facility], you and your health needs are our priority.  As part of our continuing mission to provide you with exceptional heart care, we have created designated Provider Care Teams.  These Care Teams include your primary Cardiologist (physician) and Advanced Practice Providers (APPs -  Physician Assistants and Nurse Practitioners) who all work together to provide you with the care you need, when you need it.  We recommend signing up for the patient portal called "MyChart".  Sign up information is provided on this After Visit Summary.  MyChart is used to connect with patients for Virtual Visits (Telemedicine).  Patients are able to view lab/test results, encounter notes, upcoming appointments, etc.  Non-urgent messages can be sent to your provider as well.   To learn more about what you can do with MyChart, go to [email or web address].    Your next appointment:    AS NEEDED with Dr. Hilty  

## 2023-04-04 NOTE — Progress Notes (Signed)
LIPID CLINIC CONSULT NOTE  Chief Complaint:  Follow-up dyslipidemia  Primary Care Physician: Jackelyn Poling, DO  Primary Cardiologist:  Chrystie Nose, MD  HPI:  Valerie Crawford is a 72 y.o. female who is being seen today for the evaluation of dyslipidemia at the request of Jackelyn Poling, DO.  This is a pleasant 72 year old female kindly referred for evaluation and management of dyslipidemia.  She reports this is been a longstanding problem and she is seen multiple doctors, prior cardiologist and other providers and management of this both out in [place] and more recently when she was living in [place].  She is recently retired and was referred for further evaluation and management.  She brought records with her from the past 5 or 6 years which showed significant dyslipidemia.  Only at 1 point where she able to reduce her LDL cholesterol well below 200 with significant dietary changes.  She was on the "O blood type" diet.  Unfortunately she has not been able to tolerate statins, all caused flulike symptoms, at least 3 statins and to high potency statins in the past.  She also recently tried ezetimibe and said overall she had flulike symptoms, myalgias and just felt "bad".  Her most recent lipid profile showed total cholesterol 329, triglycerides 342, HDL 46 and LDL of 212.  She reports her mother had significant dyslipidemia however had no early onset heart disease.  02/22/2020  Ms. Stuffle returns today for follow-up.  Only approved for Nexletol as an alternative for management of dyslipidemia.  After much discussion with her, she has not started the medication.  She says she has a lot of hesitation about the possible side effects of medication including risk for upper respiratory infection.  I told her that in my experience so far over the past year as well as preclinical trial experience, this was extremely unlikely and perhaps only happened to 1 patient.  Nonetheless, she is still very  hesitant to use the medication.  09/27/2022  Ms. Blackshire seen today in follow-up.  Unfortunately since I last saw her she has had a stroke.  She was intolerant to statins and we discussed the possibility of using Nexletol but she was concerned about side effects.  We also discussed PCSK9 inhibitors, but she had no identifiable cardiovascular disease and was concerned about side effects.  She now returns and does seem somewhat interested in further lipid-lowering although has significant concerns about possible side effects.  We discussed the possibility of PCSK9 inhibitors or Leqvio today.  Her insurance may be more likely to cover this.  In addition, she would really benefit from an LP(a) evaluation.  04/04/2023  Ms. Rittinger returns today for follow-up.  She had benefits determination for Leqvio and was approved however has been hesitant to take it.  She had repeat lipids done 8 days ago which showed a particle number of 1004 and 83, LDL-C of 208, small LDL particle number was actually low at 472 and triglycerides were high at 354.  PMHx:  Past Medical History:  Diagnosis Date   Allergies    Thyroid disease     Past Surgical History:  Procedure Laterality Date   TONSILLECTOMY      FAMHx:  Family History  Problem Relation Age of Onset   Hyperlipidemia Mother    Arthritis Father     SOCHx:   reports that she quit smoking about 54 years ago. Her smoking use included cigarettes. She has quit using smokeless tobacco. She reports that  she does not currently use alcohol. She reports that she does not use drugs.  ALLERGIES:  Allergies  Allergen Reactions   Other     Contrast dye - rash   Statins     Flu like syndrome   Iodine Rash    ROS: Pertinent items noted in HPI and remainder of comprehensive ROS otherwise negative.  HOME MEDS: Current Outpatient Medications on File Prior to Visit  Medication Sig Dispense Refill   EPINEPHrine 0.3 mg/0.3 mL IJ SOAJ injection Inject 0.3 mg  into the muscle as needed for anaphylaxis. 2 each 2   fluticasone (FLONASE) 50 MCG/ACT nasal spray 1-2 sprays each nostril 1-7 times per week 16 g 5   levothyroxine (SYNTHROID) 75 MCG tablet Take 75 mcg by mouth daily before breakfast.     No current facility-administered medications on file prior to visit.    LABS/IMAGING: No results found for this or any previous visit (from the past 48 hour(s)). No results found.  LIPID PANEL:    Component Value Date/Time   CHOL 248 (H) 07/28/2022 2224   TRIG 194 (H) 07/28/2022 2224   HDL 54 07/28/2022 2224   CHOLHDL 4.6 07/28/2022 2224   VLDL 39 07/28/2022 2224   LDLCALC 155 (H) 07/28/2022 2224    WEIGHTS: Wt Readings from Last 3 Encounters:  04/04/23 163 lb 12.8 oz (74.3 kg)  02/09/23 168 lb 12.8 oz (76.6 kg)  09/27/22 169 lb (76.7 kg)    VITALS: BP 132/60   Pulse 78   Ht 5\' 3"  (1.6 m)   Wt 163 lb 12.8 oz (74.3 kg)   SpO2 97%   BMI 29.02 kg/m   EXAM: Deferred  EKG: Deferred  ASSESSMENT: Possible familial combined hyperlipidemia History of stroke Statin intolerance  PLAN: 1.   Ms. Reasner has had prior stroke and is statin intolerant.  She was hesitant to take the PCSK9 inhibitors due to concern about side effects and has also been hesitant to take Leqvio.  Her lipids do show high LDL cholesterol although it seems like she has large LDL particles since her small LDL particle numbers are low however LDL size was not actually elevated.  She does not eat a high cholesterol diet so it is difficult to explain why her LDL is actually much more significantly elevated than her particle number.  Her triglycerides however are very high as well at 354.  I would advise therapy and recommend her LDL target to less than 70 but at this time she is not interested in the medication due to concern about side effects.  Follow-up with me as needed.  Chrystie Nose, MD, [facility], FACP  [place]  [facility]  Medical Director of the  [facility] Lipid [facility] &  Cardiovascular Risk Reduction Clinic Diplomate of the American Board of Clinical Lipidology Attending Cardiologist  Direct Dial: [phone]  Fax: [phone]  Website:  [email or web address]  Lisette Abu Lavere Shinsky 04/04/2023, 1:42 PM

## 2023-04-11 ENCOUNTER — Ambulatory Visit (INDEPENDENT_AMBULATORY_CARE_PROVIDER_SITE_OTHER): Payer: Medicare Other | Admitting: *Deleted

## 2023-04-11 DIAGNOSIS — J309 Allergic rhinitis, unspecified: Secondary | ICD-10-CM

## 2023-04-18 ENCOUNTER — Ambulatory Visit (INDEPENDENT_AMBULATORY_CARE_PROVIDER_SITE_OTHER): Payer: Medicare Other | Admitting: *Deleted

## 2023-04-18 DIAGNOSIS — J309 Allergic rhinitis, unspecified: Secondary | ICD-10-CM

## 2023-04-25 ENCOUNTER — Ambulatory Visit (INDEPENDENT_AMBULATORY_CARE_PROVIDER_SITE_OTHER): Payer: Medicare Other

## 2023-04-25 DIAGNOSIS — J309 Allergic rhinitis, unspecified: Secondary | ICD-10-CM | POA: Diagnosis not present

## 2023-05-05 ENCOUNTER — Ambulatory Visit (INDEPENDENT_AMBULATORY_CARE_PROVIDER_SITE_OTHER): Payer: Medicare Other | Admitting: *Deleted

## 2023-05-05 DIAGNOSIS — J309 Allergic rhinitis, unspecified: Secondary | ICD-10-CM

## 2023-05-16 ENCOUNTER — Ambulatory Visit (INDEPENDENT_AMBULATORY_CARE_PROVIDER_SITE_OTHER): Payer: Medicare Other

## 2023-05-16 DIAGNOSIS — J309 Allergic rhinitis, unspecified: Secondary | ICD-10-CM

## 2023-05-30 ENCOUNTER — Ambulatory Visit (INDEPENDENT_AMBULATORY_CARE_PROVIDER_SITE_OTHER): Payer: Medicare Other

## 2023-05-30 DIAGNOSIS — J309 Allergic rhinitis, unspecified: Secondary | ICD-10-CM | POA: Diagnosis not present

## 2023-06-13 ENCOUNTER — Ambulatory Visit (INDEPENDENT_AMBULATORY_CARE_PROVIDER_SITE_OTHER): Payer: Medicare Other

## 2023-06-13 DIAGNOSIS — J309 Allergic rhinitis, unspecified: Secondary | ICD-10-CM | POA: Diagnosis not present

## 2023-06-27 ENCOUNTER — Ambulatory Visit (INDEPENDENT_AMBULATORY_CARE_PROVIDER_SITE_OTHER): Payer: Medicare Other | Admitting: *Deleted

## 2023-06-27 DIAGNOSIS — J309 Allergic rhinitis, unspecified: Secondary | ICD-10-CM

## 2023-06-29 DIAGNOSIS — J3089 Other allergic rhinitis: Secondary | ICD-10-CM | POA: Diagnosis not present

## 2023-06-29 NOTE — Progress Notes (Signed)
VIALS EXP 06-28-24

## 2023-06-30 DIAGNOSIS — J302 Other seasonal allergic rhinitis: Secondary | ICD-10-CM | POA: Diagnosis not present

## 2023-07-11 ENCOUNTER — Ambulatory Visit: Payer: Self-pay | Admitting: *Deleted

## 2023-07-11 DIAGNOSIS — J309 Allergic rhinitis, unspecified: Secondary | ICD-10-CM | POA: Diagnosis not present

## 2023-07-20 ENCOUNTER — Other Ambulatory Visit: Payer: Self-pay | Admitting: Sports Medicine

## 2023-07-20 DIAGNOSIS — M545 Low back pain, unspecified: Secondary | ICD-10-CM

## 2023-07-25 ENCOUNTER — Ambulatory Visit (INDEPENDENT_AMBULATORY_CARE_PROVIDER_SITE_OTHER): Payer: Self-pay

## 2023-07-25 DIAGNOSIS — J309 Allergic rhinitis, unspecified: Secondary | ICD-10-CM | POA: Diagnosis not present

## 2023-08-01 ENCOUNTER — Ambulatory Visit (INDEPENDENT_AMBULATORY_CARE_PROVIDER_SITE_OTHER): Payer: Self-pay | Admitting: *Deleted

## 2023-08-01 DIAGNOSIS — J309 Allergic rhinitis, unspecified: Secondary | ICD-10-CM

## 2023-08-22 ENCOUNTER — Other Ambulatory Visit: Payer: Self-pay | Admitting: Allergy

## 2023-10-19 ENCOUNTER — Other Ambulatory Visit: Payer: Self-pay | Admitting: Family Medicine

## 2023-10-19 DIAGNOSIS — M8580 Other specified disorders of bone density and structure, unspecified site: Secondary | ICD-10-CM

## 2023-10-19 DIAGNOSIS — Z0000 Encounter for general adult medical examination without abnormal findings: Secondary | ICD-10-CM

## 2023-10-23 ENCOUNTER — Ambulatory Visit
Admission: RE | Admit: 2023-10-23 | Discharge: 2023-10-23 | Disposition: A | Discharge status: Home / Self Care | Payer: Medicare Other | Source: Ambulatory Visit | Attending: Sports Medicine | Admitting: Sports Medicine

## 2023-10-23 DIAGNOSIS — M545 Low back pain, unspecified: Secondary | ICD-10-CM

## 2023-11-16 ENCOUNTER — Ambulatory Visit
Admission: RE | Admit: 2023-11-16 | Discharge: 2023-11-16 | Disposition: A | Discharge status: Home / Self Care | Payer: Medicare Other | Source: Ambulatory Visit | Attending: Family Medicine | Admitting: Family Medicine

## 2023-11-16 DIAGNOSIS — Z0000 Encounter for general adult medical examination without abnormal findings: Secondary | ICD-10-CM

## 2024-06-11 ENCOUNTER — Other Ambulatory Visit: Payer: Medicare Other

## 2024-06-17 ENCOUNTER — Ambulatory Visit (HOSPITAL_BASED_OUTPATIENT_CLINIC_OR_DEPARTMENT_OTHER)
Admission: RE | Admit: 2024-06-17 | Discharge: 2024-06-17 | Disposition: A | Discharge status: Home / Self Care | Source: Ambulatory Visit | Attending: Family Medicine | Admitting: Family Medicine

## 2024-06-17 DIAGNOSIS — M8589 Other specified disorders of bone density and structure, multiple sites: Secondary | ICD-10-CM | POA: Diagnosis not present

## 2024-06-17 DIAGNOSIS — Z13820 Encounter for screening for osteoporosis: Secondary | ICD-10-CM | POA: Diagnosis not present

## 2024-06-17 DIAGNOSIS — M8580 Other specified disorders of bone density and structure, unspecified site: Secondary | ICD-10-CM | POA: Insufficient documentation
